# Patient Record
Sex: Male | Born: 1995 | Race: White | Hispanic: No | Marital: Single | Smoking: Never smoker
Health system: Southern US, Community
[De-identification: ages and names within clinical notes are randomized; demographics above are authoritative.]

---

## 2016-10-27 ENCOUNTER — Encounter (INDEPENDENT_AMBULATORY_CARE_PROVIDER_SITE_OTHER): Payer: Self-pay

## 2016-11-04 ENCOUNTER — Ambulatory Visit (INDEPENDENT_AMBULATORY_CARE_PROVIDER_SITE_OTHER): Payer: Self-pay | Admitting: Podiatry

## 2016-11-04 ENCOUNTER — Ambulatory Visit: Payer: Self-pay

## 2016-11-04 ENCOUNTER — Encounter: Payer: Self-pay | Admitting: Podiatry

## 2016-11-04 VITALS — BP 118/79 | HR 64 | Ht 68.0 in | Wt 180.0 lb

## 2016-11-04 DIAGNOSIS — R252 Cramp and spasm: Secondary | ICD-10-CM

## 2016-11-04 DIAGNOSIS — Q6602 Congenital talipes equinovarus, left foot: Secondary | ICD-10-CM

## 2016-11-04 DIAGNOSIS — Q660 Congenital talipes equinovarus: Secondary | ICD-10-CM

## 2016-11-04 MED ORDER — CYCLOBENZAPRINE HCL 10 MG PO TABS: 10.0000 mg | ORAL_TABLET | Freq: Three times a day (TID) | ORAL | 0 refills | Status: DC | PRN
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
## 2016-11-04 NOTE — Addendum Note (Signed)
Addended by: Renaldo ReelPARRY, Talecia Sherlin A on: 11/04/2016 02:21 PM   Modules accepted: Orders

## 2017-10-06 ENCOUNTER — Encounter (HOSPITAL_COMMUNITY): Payer: Self-pay | Admitting: Behavioral Health

## 2017-10-06 ENCOUNTER — Other Ambulatory Visit: Payer: Self-pay

## 2017-10-06 ENCOUNTER — Inpatient Hospital Stay (HOSPITAL_COMMUNITY)
Admission: AD | Admit: 2017-10-06 | Discharge: 2017-10-12 | DRG: 885 | Disposition: A | Discharge status: Home / Self Care | Payer: Federal, State, Local not specified - Other | Source: Intra-hospital | Attending: Psychiatry | Admitting: Psychiatry

## 2017-10-06 DIAGNOSIS — Z560 Unemployment, unspecified: Secondary | ICD-10-CM | POA: Diagnosis not present

## 2017-10-06 DIAGNOSIS — F1520 Other stimulant dependence, uncomplicated: Secondary | ICD-10-CM | POA: Diagnosis present

## 2017-10-06 DIAGNOSIS — F1220 Cannabis dependence, uncomplicated: Secondary | ICD-10-CM | POA: Diagnosis present

## 2017-10-06 DIAGNOSIS — R110 Nausea: Secondary | ICD-10-CM | POA: Diagnosis not present

## 2017-10-06 DIAGNOSIS — R45851 Suicidal ideations: Secondary | ICD-10-CM | POA: Diagnosis present

## 2017-10-06 DIAGNOSIS — Z818 Family history of other mental and behavioral disorders: Secondary | ICD-10-CM | POA: Diagnosis not present

## 2017-10-06 DIAGNOSIS — M25572 Pain in left ankle and joints of left foot: Secondary | ICD-10-CM | POA: Diagnosis present

## 2017-10-06 DIAGNOSIS — T43225A Adverse effect of selective serotonin reuptake inhibitors, initial encounter: Secondary | ICD-10-CM | POA: Diagnosis not present

## 2017-10-06 DIAGNOSIS — J45909 Unspecified asthma, uncomplicated: Secondary | ICD-10-CM | POA: Diagnosis present

## 2017-10-06 DIAGNOSIS — Z915 Personal history of self-harm: Secondary | ICD-10-CM

## 2017-10-06 DIAGNOSIS — F1994 Other psychoactive substance use, unspecified with psychoactive substance-induced mood disorder: Secondary | ICD-10-CM | POA: Diagnosis present

## 2017-10-06 DIAGNOSIS — Y92230 Patient room in hospital as the place of occurrence of the external cause: Secondary | ICD-10-CM | POA: Diagnosis not present

## 2017-10-06 DIAGNOSIS — F419 Anxiety disorder, unspecified: Secondary | ICD-10-CM | POA: Diagnosis present

## 2017-10-06 DIAGNOSIS — F332 Major depressive disorder, recurrent severe without psychotic features: Secondary | ICD-10-CM | POA: Diagnosis present

## 2017-10-06 DIAGNOSIS — G8929 Other chronic pain: Secondary | ICD-10-CM | POA: Diagnosis present

## 2017-10-06 DIAGNOSIS — Z79899 Other long term (current) drug therapy: Secondary | ICD-10-CM

## 2017-10-06 DIAGNOSIS — Q6689 Other  specified congenital deformities of feet: Secondary | ICD-10-CM | POA: Diagnosis not present

## 2017-10-06 DIAGNOSIS — R450 Nervousness: Secondary | ICD-10-CM | POA: Diagnosis not present

## 2017-10-06 DIAGNOSIS — G4700 Insomnia, unspecified: Secondary | ICD-10-CM | POA: Diagnosis present

## 2017-10-06 LAB — RAPID URINE DRUG SCREEN, HOSP PERFORMED
Amphetamines: POSITIVE — AB
BARBITURATES: NOT DETECTED
BENZODIAZEPINES: NOT DETECTED
COCAINE: NOT DETECTED
Opiates: NOT DETECTED
Tetrahydrocannabinol: POSITIVE — AB

## 2017-10-06 MED ORDER — MAGNESIUM HYDROXIDE 400 MG/5ML PO SUSP: 30.0000 mL | Freq: Every day | ORAL | Status: DC | PRN

## 2017-10-06 MED ORDER — GABAPENTIN 300 MG PO CAPS
300.0000 mg | ORAL_CAPSULE | Freq: Three times a day (TID) | ORAL | Status: DC
  Filled 2017-10-06 (×7): qty 1

## 2017-10-06 MED ORDER — ACETAMINOPHEN 325 MG PO TABS: 650.0000 mg | ORAL_TABLET | Freq: Four times a day (QID) | ORAL | Status: DC | PRN

## 2017-10-06 MED ORDER — HYDROXYZINE HCL 25 MG PO TABS
25.0000 mg | ORAL_TABLET | Freq: Three times a day (TID) | ORAL | Status: DC | PRN
  Filled 2017-10-06: qty 10

## 2017-10-06 MED ORDER — TRAZODONE HCL 50 MG PO TABS
50.0000 mg | ORAL_TABLET | Freq: Every evening | ORAL | Status: DC | PRN
  Administered 2017-10-11: 50 mg via ORAL
  Filled 2017-10-06 (×3): qty 1
  Filled 2017-10-06: qty 7

## 2017-10-06 MED ORDER — PNEUMOCOCCAL VAC POLYVALENT 25 MCG/0.5ML IJ INJ: 0.5000 mL | INJECTION | INTRAMUSCULAR | Status: DC

## 2017-10-06 MED ORDER — ALUM & MAG HYDROXIDE-SIMETH 200-200-20 MG/5ML PO SUSP: 30.0000 mL | ORAL | Status: DC | PRN

## 2017-10-06 NOTE — Progress Notes (Signed)
Unable to release signed and held orders.  On-site provider notified and new admission orders were placed and released.
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
## 2017-10-06 NOTE — Tx Team (Signed)
Initial Treatment Plan 10/06/2017 2:15 PM Bradley ArntAlex J Bunning [id number]:[id number]    PATIENT STRESSORS: Financial difficulties Health problems Marital or family conflict Occupational concerns Substance abuse   PATIENT STRENGTHS: Ability for insight Capable of independent living Communication skills General fund of knowledge Motivation for treatment/growth   PATIENT IDENTIFIED PROBLEMS: "learn skills to connect with people"  "learn healthy coping skills to battle addiction"                   DISCHARGE CRITERIA:  Adequate post-discharge living arrangements Improved stabilization in mood, thinking, and/or behavior  PRELIMINARY DISCHARGE PLAN: Return to previous living arrangement  PATIENT/FAMILY INVOLVEMENT: This treatment plan has been presented to and reviewed with the patient, Bradley Gibson.  The patient and family have been given the opportunity to ask questions and make suggestions.  Raylene MiyamotoMichael R Cherylin Waguespack, RN 10/06/2017, 2:15 PM

## 2017-10-06 NOTE — Progress Notes (Signed)
D: Pt was in bed in his room upon initial approach.  Pt presents with depressed affect and mood.  He reports he is feeling "fine" and his goal is to "catch up on some sleep."  Pt denies SI/HI, denies hallucinations, denies pain, denies withdrawal symptoms.  Pt has been isolative to his room tonight and he was excused from evening group.   A: Introduced self to pt.  Actively listened to pt and offered support and encouragement. Q15 minute safety checks maintained.  R: Pt is safe on the unit.  Pt verbally contracts for safety.  Will continue to monitor and assess.
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
## 2017-10-07 ENCOUNTER — Encounter (HOSPITAL_COMMUNITY): Payer: Self-pay | Admitting: Behavioral Health

## 2017-10-07 DIAGNOSIS — R450 Nervousness: Secondary | ICD-10-CM

## 2017-10-07 DIAGNOSIS — G4700 Insomnia, unspecified: Secondary | ICD-10-CM

## 2017-10-07 DIAGNOSIS — F1994 Other psychoactive substance use, unspecified with psychoactive substance-induced mood disorder: Secondary | ICD-10-CM

## 2017-10-07 DIAGNOSIS — F332 Major depressive disorder, recurrent severe without psychotic features: Principal | ICD-10-CM

## 2017-10-07 DIAGNOSIS — R45851 Suicidal ideations: Secondary | ICD-10-CM

## 2017-10-07 LAB — CBC WITH DIFFERENTIAL/PLATELET
BASOS ABS: 0.1 10*3/uL (ref 0.0–0.1)
BASOS PCT: 1 %
EOS ABS: 0.6 10*3/uL (ref 0.0–0.7)
Eosinophils Relative: 6 %
HCT: 52.1 % — ABNORMAL HIGH (ref 39.0–52.0)
HEMOGLOBIN: 17.6 g/dL — AB (ref 13.0–17.0)
Lymphocytes Relative: 39 %
Lymphs Abs: 3.8 10*3/uL (ref 0.7–4.0)
MCH: 30.1 pg (ref 26.0–34.0)
MCHC: 33.8 g/dL (ref 30.0–36.0)
MCV: 89.2 fL (ref 78.0–100.0)
MONO ABS: 0.6 10*3/uL (ref 0.1–1.0)
MONOS PCT: 7 %
NEUTROS ABS: 4.5 10*3/uL (ref 1.7–7.7)
NEUTROS PCT: 47 %
Platelets: 231 10*3/uL (ref 150–400)
RBC: 5.84 MIL/uL — ABNORMAL HIGH (ref 4.22–5.81)
RDW: 14 % (ref 11.5–15.5)
WBC: 9.6 10*3/uL (ref 4.0–10.5)

## 2017-10-07 LAB — COMPREHENSIVE METABOLIC PANEL
ALBUMIN: 4.6 g/dL (ref 3.5–5.0)
ALT: 29 U/L (ref 0–44)
ANION GAP: 16 — AB (ref 5–15)
AST: 31 U/L (ref 15–41)
Alkaline Phosphatase: 67 U/L (ref 38–126)
BILIRUBIN TOTAL: 1.6 mg/dL — AB (ref 0.3–1.2)
BUN: 11 mg/dL (ref 6–20)
CHLORIDE: 106 mmol/L (ref 98–111)
CO2: 20 mmol/L — AB (ref 22–32)
Calcium: 9.6 mg/dL (ref 8.9–10.3)
Creatinine, Ser: 1.27 mg/dL — ABNORMAL HIGH (ref 0.61–1.24)
GFR calc Af Amer: >60 mL/min (ref >60)
GFR calc non Af Amer: >60 mL/min (ref >60)
Glucose, Bld: 86 mg/dL (ref 70–99)
POTASSIUM: 3.8 mmol/L (ref 3.5–5.1)
SODIUM: 142 mmol/L (ref 135–145)
Total Protein: 7.9 g/dL (ref 6.5–8.1)

## 2017-10-07 LAB — LIPID PANEL
CHOL/HDL RATIO: 2.4 ratio
Cholesterol: 100 mg/dL (ref 0–200)
HDL: 42 mg/dL (ref >40)
LDL CALC: 49 mg/dL (ref 0–99)
Triglycerides: 47 mg/dL (ref <150)
VLDL: 9 mg/dL (ref 0–40)

## 2017-10-07 LAB — HEMOGLOBIN A1C
Hgb A1c MFr Bld: 4.9 % (ref 4.8–5.6)
Mean Plasma Glucose: 93.93 mg/dL

## 2017-10-07 LAB — TSH: TSH: 2.465 u[IU]/mL (ref 0.350–4.500)

## 2017-10-07 LAB — ETHANOL: Alcohol, Ethyl (B): <10 mg/dL (ref <10)

## 2017-10-07 MED ORDER — SERTRALINE HCL 50 MG PO TABS
50.0000 mg | ORAL_TABLET | Freq: Every day | ORAL | Status: DC
  Administered 2017-10-07 – 2017-10-08 (×2): 50 mg via ORAL
  Filled 2017-10-07 (×5): qty 1

## 2017-10-07 MED ORDER — GABAPENTIN 100 MG PO CAPS
100.0000 mg | ORAL_CAPSULE | Freq: Three times a day (TID) | ORAL | Status: DC
  Filled 2017-10-07 (×9): qty 1

## 2017-10-07 NOTE — Progress Notes (Signed)
Pt did not attend NA group this evening.  
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
## 2017-10-07 NOTE — [facility] Group Notes (Signed)
LCSW Group Therapy Note 10/07/2017 3:34 PM  Type of Therapy and Topic: Group Therapy: Overcoming Obstacles  Participation Level: Active  Description of Group:  In this group patients will be encouraged to explore what they see as obstacles to their own wellness and recovery. They will be guided to discuss their thoughts, feelings, and behaviors related to these obstacles. The group will process together ways to cope with barriers, with attention given to specific choices patients can make. Each patient will be challenged to identify changes they are motivated to make in order to overcome their obstacles. This group will be process-oriented, with patients participating in exploration of their own experiences as well as giving and receiving support and challenge from other group members.  Therapeutic Goals: 1. Patient will identify personal and current obstacles as they relate to admission. 2. Patient will identify barriers that currently interfere with their wellness or overcoming obstacles.  3. Patient will identify feelings, thought process and behaviors related to these barriers. 4. Patient will identify two changes they are willing to make to overcome these obstacles:   Summary of Patient Progress Trinna Postlex was engaged and participated throughout the group session. Deveon reports that his current obstacle is "I'm trying to figure out what is going on in my head". Trinna Postlex states that he does not know how he plans to overcome this obstacle at this time.     Therapeutic Modalities:  Cognitive Behavioral Therapy Solution Focused Therapy Motivational Interviewing Relapse Prevention Therapy   Alcario DroughtJolan Dayten Juba LCSWA Clinical Social Worker

## 2017-10-07 NOTE — Plan of Care (Signed)
  Problem: Safety: Goal: Periods of time without injury will increase Outcome: Progressing-No self injurious behaviors observed.   Problem: Self-Concept: Goal: Ability to disclose and discuss suicidal ideas will improve Outcome: Progressing-Pt disclosed to writer passive SI with no plan or intent. Pt verbalized being able to talk to staff and not act out on suicidal thoughts.

## 2017-10-07 NOTE — Tx Team (Signed)
Interdisciplinary Treatment and Diagnostic Plan Update  10/07/2017 Time of Session: 10:40am Bradley Gibson MRN: [id number]  Principal Diagnosis: Major depressive disorder, recurrent episode, severe (HCC)  Secondary Diagnoses: Principal Problem:   Major depressive disorder, recurrent episode, severe (HCC) Active Problems:   Substance induced mood disorder (HCC)   Current Medications:  Current Facility-Administered Medications  Medication Dose Route Frequency Provider Last Rate Last Dose  . acetaminophen (TYLENOL) tablet 650 mg  650 mg Oral Q6H PRN Parks, Laurie Britton, NP      . alum & mag hydroxide-simeth (MAALOX/MYLANTA) 200-200-20 MG/5ML suspension 30 mL  30 mL Oral Q4H PRN Parks, Laurie Britton, NP      . gabapentin (NEURONTIN) capsule 300 mg  300 mg Oral TID Parks, Laurie Britton, NP      . hydrOXYzine (ATARAX/VISTARIL) tablet 25 mg  25 mg Oral TID PRN Parks, Laurie Britton, NP      . magnesium hydroxide (MILK OF MAGNESIA) suspension 30 mL  30 mL Oral Daily PRN Parks, Laurie Britton, NP      . pneumococcal 23 valent vaccine (PNU-IMMUNE) injection 0.5 mL  0.5 mL Intramuscular Tomorrow-1000 Cobos, Fernando A, MD      . traZODone (DESYREL) tablet 50 mg  50 mg Oral QHS PRN Parks, Laurie Britton, NP       PTA Medications: Medications Prior to Admission  Medication Sig Dispense Refill Last Dose  . albuterol (PROVENTIL HFA) 108 (90 Base) MCG/ACT inhaler    Taking  . cyclobenzaprine (FLEXERIL) 10 MG tablet Take 1 tablet (10 mg total) by mouth 3 (three) times daily as needed for muscle spasms. (Patient not taking: Reported on 10/06/2017) 30 tablet 0 Not Taking at Unknown time    Patient Stressors: Financial difficulties Health problems Marital or family conflict Occupational concerns Substance abuse  Patient Strengths: Ability for insight Capable of independent living Communication skills General fund of knowledge Motivation for treatment/growth  Treatment Modalities: Medication  Management, Group therapy, Case management,  1 to 1 session with clinician, Psychoeducation, Recreational therapy.   Physician Treatment Plan for Primary Diagnosis: Major depressive disorder, recurrent episode, severe (HCC) Long Term Goal(s): Improvement in symptoms so as ready for discharge Improvement in symptoms so as ready for discharge   Short Term Goals: Ability to identify changes in lifestyle to reduce recurrence of condition will improve Ability to verbalize feelings will improve Ability to disclose and discuss suicidal ideas Compliance with prescribed medications will improve Ability to identify triggers associated with substance abuse/mental health issues will improve Ability to disclose and discuss suicidal ideas Ability to demonstrate self-control will improve Ability to identify and develop effective coping behaviors will improve Ability to maintain clinical measurements within normal limits will improve  Medication Management: Evaluate patient's response, side effects, and tolerance of medication regimen.  Therapeutic Interventions: 1 to 1 sessions, Unit Group sessions and Medication administration.  Evaluation of Outcomes: Not Met  Physician Treatment Plan for Secondary Diagnosis: Principal Problem:   Major depressive disorder, recurrent episode, severe (HCC) Active Problems:   Substance induced mood disorder (HCC)  Long Term Goal(s): Improvement in symptoms so as ready for discharge Improvement in symptoms so as ready for discharge   Short Term Goals: Ability to identify changes in lifestyle to reduce recurrence of condition will improve Ability to verbalize feelings will improve Ability to disclose and discuss suicidal ideas Compliance with prescribed medications will improve Ability to identify triggers associated with substance abuse/mental health issues will improve Ability to disclose and discuss suicidal ideas Ability to   demonstrate self-control will  improve Ability to identify and develop effective coping behaviors will improve Ability to maintain clinical measurements within normal limits will improve     Medication Management: Evaluate patient's response, side effects, and tolerance of medication regimen.  Therapeutic Interventions: 1 to 1 sessions, Unit Group sessions and Medication administration.  Evaluation of Outcomes: Not Met   RN Treatment Plan for Primary Diagnosis: Major depressive disorder, recurrent episode, severe (HCC) Long Term Goal(s): Knowledge of disease and therapeutic regimen to maintain health will improve  Short Term Goals: Ability to participate in decision making will improve, Ability to verbalize feelings will improve, Ability to identify and develop effective coping behaviors will improve and Compliance with prescribed medications will improve  Medication Management: RN will administer medications as ordered by provider, will assess and evaluate patient's response and provide education to patient for prescribed medication. RN will report any adverse and/or side effects to prescribing provider.  Therapeutic Interventions: 1 on 1 counseling sessions, Psychoeducation, Medication administration, Evaluate responses to treatment, Monitor vital signs and CBGs as ordered, Perform/monitor CIWA, COWS, AIMS and Fall Risk screenings as ordered, Perform wound care treatments as ordered.  Evaluation of Outcomes: Not Met   LCSW Treatment Plan for Primary Diagnosis: Major depressive disorder, recurrent episode, severe (HCC) Long Term Goal(s): Safe transition to appropriate next level of care at discharge, Engage patient in therapeutic group addressing interpersonal concerns.  Short Term Goals: Engage patient in aftercare planning with referrals and resources  Therapeutic Interventions: Assess for all discharge needs, 1 to 1 time with Social worker, Explore available resources and support systems, Assess for adequacy in  community support network, Educate family and significant other(s) on suicide prevention, Complete Psychosocial Assessment, Interpersonal group therapy.  Evaluation of Outcomes: Not Met   Progress in Treatment: Attending groups: No. New to unit Participating in groups: No. Taking medication as prescribed: Yes. Toleration medication: Yes. Family/Significant other contact made: No, will contact:  patient refuses consent Patient understands diagnosis: Yes. Discussing patient identified problems/goals with staff: Yes. Medical problems stabilized or resolved: Yes. Denies suicidal/homicidal ideation: Yes. Issues/concerns per patient self-inventory: No. Other:   New problem(s) identified: None   New Short Term/Long Term Goal(s): Detox, medication stabilization, elimination of SI thoughts, development of comprehensive mental wellness plan.   Patient Goals:  ZTo learn skills to connect with people and to learn healthy copings skills to battle addiction  Discharge Plan or Barriers: CSW will assess for appropriate referrals and discharge planning.   Reason for Continuation of Hospitalization: Anxiety Depression Medication stabilization Suicidal ideation  Estimated Length of Stay: 3-5 days   Attendees: Patient: 10/07/2017 10:53 AM  Physician: Dr. Fernando Cobos, MD 10/07/2017 10:53 AM  Nursing: Patrice White, RN 10/07/2017 10:53 AM  RN Care Manager: X 10/07/2017 10:53 AM  Social Worker:  , LCSWA 10/07/2017 10:53 AM  Recreational Therapist: X 10/07/2017 10:53 AM  Other: X 10/07/2017 10:53 AM  Other: X 10/07/2017 10:53 AM  Other:X 10/07/2017 10:53 AM    Scribe for Treatment Team:  E , LCSWA 10/07/2017 10:53 AM 

## 2017-10-07 NOTE — Progress Notes (Signed)
Pt presents with a flat affect and depressed mood. Pt endorses passive suicidal thoughts with no plan or intent. Pt reported feeling depressed but stated that he doesn't want to take antidepressants because they make him fell like a zombie. Pt refused to take Neurontin as scheduled this morning, when offered. Pt stated that he wasn't taking Neurontin prior to admission. Pt denies having any anxiety and doesn't feel the need to take Neurontin. Pt would like to discuss medications with MD. Dr. Jama Flavorsobos made aware of pt's request in tx team. Pt rated on his self inventory sheet: depression 7/10, anxiety 0, and hopelessness 7/10.   Medications reviewed with pt. V/s assessed. Verbal support provided. Pt encouraged to attend groups. 15 minute checks performed for safety.  Pt receptive to tx and would like to discuss tx plan with MD.
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
## 2017-10-07 NOTE — [facility] Suicide Risk Assessment (Signed)
[facility] INPATIENT:  Family/Significant Other Suicide Prevention Education  Suicide Prevention Education:  Patient Refusal for Family/Significant Other Suicide Prevention Education: The patient Bradley Gibson has refused to provide written consent for family/significant other to be provided Family/Significant Other Suicide Prevention Education during admission and/or prior to discharge.  Physician notified.  SPE completed with patient, as patient refused to consent to family contact. SPI pamphlet provided to pt and pt was encouraged to share information with support network, ask questions, and talk about any concerns relating to SPE. Patient denies access to guns/firearms and verbalized understanding of information provided. Mobile Crisis information also provided to patient.    [name] 10/07/2017, 9:47 AM
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
## 2017-10-08 ENCOUNTER — Encounter (HOSPITAL_COMMUNITY): Payer: Self-pay | Admitting: Behavioral Health

## 2017-10-08 NOTE — Progress Notes (Signed)
Pt presents with a flat affect and depressed mood. Pt reported that he doesn't consider himself to be depressed but feels numb and empty inside. Pt expressed having difficulty connecting with people emotionally. Pt denies SI but reported not caring if he's dead or alive. Pt denies wanting to harm himself. Pt reported taking Zoloft yesterday for the first time and experienced hot flashes after taking the medicine. Pt refuses to take Neurontin as scheduled due to not having anxiety. Pt less isolative today and is compliant with attending groups.   Medications reviewed with pt. Dr. Jama Flavorsobos informed of pt complaints regarding medications. Verbal support provided. Pt encouraged to attend groups. 15 minute checks performed for safety.  Pt receptive to tx.

## 2017-10-08 NOTE — Progress Notes (Signed)
Patient asked to come up for labs however patient states, "I was stuck yesterday. I'm not doing it again today." [facility] and phlebotomist informed.

## 2017-10-08 NOTE — Plan of Care (Signed)
  PProblem: Activity: Goal: Interest or engagement in activities will improve Outcome: Progressing-pt visible in the milieu today. Pt observed engaging with peers and attending scheduled groups.

## 2017-10-08 NOTE — Progress Notes (Signed)
D: Patient observed resting in bed all evening. States, "I've been in bed most of the day. I'm just not into groups really." Patient's affect flat, mood somewhat irritable. Forwards minimal information and is guarded. Denies pain, physical complaints.   A: No medications scheduled at this time. No prns requested or reuired. Medication education provided. Level III obs in place for safety. Emotional support offered. Patient encouraged to complete Suicide Safety Plan before discharge. Encouraged to attend and participate in unit programming.  Fall prevention plan in place and reviewed with patient as pt is a high fall risk.   R: Patient verbalizes understanding of POC, falls prevention education. Patient denies SI/HI/AVH and remains safe on level III obs. Will continue to monitor throughout the night.

## 2017-10-08 NOTE — Progress Notes (Signed)
Pt stated that today was a good day. His short term goal is to come up with an excuse as to where he has been the past week. His long term goal is [facility].

## 2017-10-08 NOTE — [facility] Group Notes (Signed)
LCSW Group Therapy Note 10/08/2017 3:27 PM  Type of Therapy and Topic: Group Therapy: Avoiding Self-Sabotaging and Enabling Behaviors  Participation Level: Active  Description of Group:  In this group, patients will learn how to identify obstacles, self-sabotaging and enabling behaviors, as well as: what are they, why do we do them and what needs these behaviors meet. Discuss unhealthy relationships and how to have positive healthy boundaries with those that sabotage and enable. Explore aspects of self-sabotage and enabling in yourself and how to limit these self-destructive behaviors in everyday life.  Therapeutic Goals: 1. Patient will identify one obstacle that relates to self-sabotage and enabling behaviors 2. Patient will identify one personal self-sabotaging or enabling behavior they did prior to admission 3. Patient will state a plan to change the above identified behavior 4. Patient will demonstrate ability to communicate their needs through discussion and/or role play.   Summary of Patient Progress:  Bradley Gibson was engaged and participated throughout the group session. Bradley Gibson states that his self sabotaging behaviors are "doing things I know is not good for me".     Therapeutic Modalities:  Cognitive Behavioral Therapy Person-Centered Therapy Motivational Interviewing   Bradley Gibson LCSWA Clinical Social Worker

## 2017-10-08 NOTE — Progress Notes (Addendum)
[facility] MD Progress Note  10/08/2017 11:46 AM Bradley Gibson  MRN:  [id number]  Subjective:  " I wasn't really depressed when I got here. I just needed to get my mind right. I don't want to take none of the medication any more. I just need to talk to someone when I get out of here."  Objective: Face to face evaluation completed, case discussed with treatment team and chart reviewed. Bradley Gibson an 22 y.o.malewhowas admitted to the unit following depression and suicidal thoughts with plans to crash his car.  During this evaluation, he is alert and oriented x4, calm and cooperative. His mood is depressed and his affect is congruent and constricted. Although his mood appears to be depressed he is minimizing depression at this time. He does note that at times, he does feel empty inside. He denies any significant neuro-vegetative symptoms. He denies anxiety, auditory or visual hallucinations, passive death wishes or active/passive suicidal thoughts. He was started on Zoloft 50 mgrs QDAY and Neurontin 100 mgrs TID however, he reports he does not want to take the medications anymore. He reports Zoloft causes nausea and states he also experienced some hot and cold spells lastnight although the Zoloft was give mid day.  Reports he does not need to be on the Neurontin because he does not feel anxious. Discussed other antidepressant options and the benefits of taking the medication although he was not open to reducing the Zoloft or switching medications. At this time, he reports he is only open to therapy only.  He denies acute pain. Describes both sleeping pattern and appetite as fair. He denies active withdrawal symptoms. At this time, he is contracting for safety on the unit.      Principal Problem: Major depressive disorder, recurrent episode, severe ([place]) Diagnosis:   Patient Active Problem List   Diagnosis Date Noted  . Major depressive disorder, recurrent episode, severe ([place]) [F33.2] 10/06/2017  .  Substance induced mood disorder ([place]) [F19.94] 10/06/2017   Total Time spent with patient: 20 minutes  Past Psychiatric History: Bipolar, depression, multiple SA as per patient report. Patient has been to [facility] in 2014. He has been on Lithium and Trazodone in the past    Past Medical History: History reviewed. No pertinent past medical history. History reviewed. No pertinent surgical history. Family History: History reviewed. No pertinent family history. Family Psychiatric  History: Mother-Bipolar, depression, psychosis,  substance abuse (pills as per patient report), Father- methamphetamine and alcohol abuse, Bipolar,  Brother has been psychiatrically hospitalized in the past.  Social History:  Social History   Substance and Sexual Activity  Alcohol Use Not on file     Social History   Substance and Sexual Activity  Drug Use Not on file    Social History   Socioeconomic History  . Marital status: Single    Spouse name: Not on file  . Number of children: Not on file  . Years of education: Not on file  . Highest education level: Not on file  Occupational History  . Not on file  Social Needs  . Financial resource strain: Not on file  . Food insecurity:    Worry: Not on file    Inability: Not on file  . Transportation needs:    Medical: Not on file    Non-medical: Not on file  Tobacco Use  . Smoking status: Never Smoker  . Smokeless tobacco: Never Used  Substance and Sexual Activity  . Alcohol use: Not on file  .  Drug use: Not on file  . Sexual activity: Not on file  Lifestyle  . Physical activity:    Days per week: Not on file    Minutes per session: Not on file  . Stress: Not on file  Relationships  . Social connections:    Talks on phone: Not on file    Gets together: Not on file    Attends religious service: Not on file    Active member of club or organization: Not on file    Attends meetings of clubs or organizations: Not on file    Relationship  status: Not on file  Other Topics Concern  . Not on file  Social History Narrative  . Not on file   Additional Social History:    Pain Medications: see MAR Prescriptions: see MAR Over the Counter: see MAR History of alcohol / drug use?: Yes Name of Substance 1: Methamphetamine, IV 1 - Amount (size/oz): 1/2 gram - gram 1 - Frequency: 2-3x weekly 1 - Last Use / Amount: 10/05/17 Name of Substance 2: THC 2 - Amount (size/oz): 1 joint  2 - Frequency: q d 2 - Last Use / Amount: 10/04/17    Sleep: Fair  Appetite:  Fair  Current Medications: Current Facility-Administered Medications  Medication Dose Route Frequency Provider Last Rate Last Dose  . acetaminophen (TYLENOL) tablet 650 mg  650 mg Oral Q6H PRN Ethelene Hal, NP      . alum & mag hydroxide-simeth (MAALOX/MYLANTA) 200-200-20 MG/5ML suspension 30 mL  30 mL Oral Q4H PRN Ethelene Hal, NP      . gabapentin (NEURONTIN) capsule 100 mg  100 mg Oral TID Bradley Gibson, Myer Peer, MD      . hydrOXYzine (ATARAX/VISTARIL) tablet 25 mg  25 mg Oral TID PRN Ethelene Hal, NP      . magnesium hydroxide (MILK OF MAGNESIA) suspension 30 mL  30 mL Oral Daily PRN Ethelene Hal, NP      . pneumococcal 23 valent vaccine (PNU-IMMUNE) injection 0.5 mL  0.5 mL Intramuscular Tomorrow-1000 Bradley Binford A, MD      . sertraline (ZOLOFT) tablet 50 mg  50 mg Oral Daily Bradley Gibson, Myer Peer, MD   50 mg at 10/08/17 0759  . traZODone (DESYREL) tablet 50 mg  50 mg Oral QHS PRN Ethelene Hal, NP        Lab Results:  Results for orders placed or performed during the hospital encounter of 10/06/17 (from the past 48 hour(s))  Urine rapid drug screen (hosp performed)     Status: Abnormal   Collection Time: 10/06/17  2:31 PM  Result Value Ref Range   Opiates NONE DETECTED NONE DETECTED   Cocaine NONE DETECTED NONE DETECTED   Benzodiazepines NONE DETECTED NONE DETECTED   Amphetamines POSITIVE (Gibson) NONE DETECTED    Tetrahydrocannabinol POSITIVE (Gibson) NONE DETECTED   Barbiturates NONE DETECTED NONE DETECTED    Comment: (NOTE) DRUG SCREEN FOR MEDICAL PURPOSES ONLY.  IF CONFIRMATION IS NEEDED FOR ANY PURPOSE, NOTIFY LAB WITHIN 5 DAYS. LOWEST DETECTABLE LIMITS FOR URINE DRUG SCREEN Drug Class                     Cutoff (ng/mL) Amphetamine and metabolites    1000 Barbiturate and metabolites    200 Benzodiazepine                 157 Tricyclics and metabolites     300 Opiates and metabolites  300 Cocaine and metabolites        300 THC                            50 Performed at [facility], [place] [address], [place], [place] [address]   TSH     Status: None   Collection Time: 10/07/17  6:28 AM  Result Value Ref Range   TSH 2.465 0.350 - 4.500 uIU/mL    Comment: Performed by Gibson 3rd Generation assay with Gibson functional sensitivity of <=0.01 uIU/mL. Performed at [facility], [place] [address] [place], [place] [address]   Hemoglobin A1c     Status: None   Collection Time: 10/07/17  6:28 AM  Result Value Ref Range   Hgb A1c MFr Bld 4.9 4.8 - 5.6 %    Comment: (NOTE) Pre diabetes:          5.7%-6.4% Diabetes:              >6.4% Glycemic control for   <7.0% adults with diabetes    Mean Plasma Glucose 93.93 mg/dL    Comment: Performed at [place] [facility] [address], [place], [place] [address]  Lipid panel     Status: None   Collection Time: 10/07/17  6:28 AM  Result Value Ref Range   Cholesterol 100 0 - 200 mg/dL   Triglycerides 47 <150 mg/dL   HDL 42 >40 mg/dL   Total CHOL/HDL Ratio 2.4 RATIO   VLDL 9 0 - 40 mg/dL   LDL Cholesterol 49 0 - 99 mg/dL    Comment:        Total Cholesterol/HDL:CHD Risk Coronary Heart Disease Risk Table                     Men   Women  1/2 Average Risk   3.4   3.3  Average Risk       5.0   4.4  2 X Average Risk   9.6   7.1  3 X Average Risk  23.4   11.0        Use the calculated Patient Ratio above and the CHD Risk  Table to determine the patient's CHD Risk.        ATP III CLASSIFICATION (LDL):  <100     mg/dL   Optimal  100-129  mg/dL   Near or Above                    Optimal  130-159  mg/dL   Borderline  160-189  mg/dL   High  >190     mg/dL   Very High Performed at [facility] [address] [place], [place] [address]   CBC with Differential/Platelet     Status: Abnormal   Collection Time: 10/07/17  6:28 AM  Result Value Ref Range   WBC 9.6 4.0 - 10.5 K/uL   RBC 5.84 (H) 4.22 - 5.81 MIL/uL   Hemoglobin 17.6 (H) 13.0 - 17.0 g/dL   HCT 52.1 (H) 39.0 - 52.0 %   MCV 89.2 78.0 - 100.0 fL   MCH 30.1 26.0 - 34.0 pg   MCHC 33.8 30.0 - 36.0 g/dL   RDW 14.0 11.5 - 15.5 %   Platelets 231 150 - 400 K/uL   Neutrophils Relative % 47 %   Neutro Abs 4.5 1.7 - 7.7 K/uL   Lymphocytes Relative 39 %  Lymphs Abs 3.8 0.7 - 4.0 K/uL   Monocytes Relative 7 %   Monocytes Absolute 0.6 0.1 - 1.0 K/uL   Eosinophils Relative 6 %   Eosinophils Absolute 0.6 0.0 - 0.7 K/uL   Basophils Relative 1 %   Basophils Absolute 0.1 0.0 - 0.1 K/uL    Comment: Performed at [facility], [place] [address] [place], [place] [address]  Comprehensive metabolic panel     Status: Abnormal   Collection Time: 10/07/17  6:28 AM  Result Value Ref Range   Sodium 142 135 - 145 mmol/L   Potassium 3.8 3.5 - 5.1 mmol/L   Chloride 106 98 - 111 mmol/L   CO2 20 (L) 22 - 32 mmol/L   Glucose, Bld 86 70 - 99 mg/dL   BUN 11 6 - 20 mg/dL   Creatinine, Ser 1.27 (H) 0.61 - 1.24 mg/dL   Calcium 9.6 8.9 - 10.3 mg/dL   Total Protein 7.9 6.5 - 8.1 g/dL   Albumin 4.6 3.5 - 5.0 g/dL   AST 31 15 - 41 U/L   ALT 29 0 - 44 U/L   Alkaline Phosphatase 67 38 - 126 U/L   Total Bilirubin 1.6 (H) 0.3 - 1.2 mg/dL   GFR calc non Af Amer >60 >60 mL/min   GFR calc Af Amer >60 >60 mL/min    Comment: (NOTE) The eGFR has been calculated using the CKD EPI equation. This calculation has not been validated in all clinical  situations. eGFR's persistently <60 mL/min signify possible Chronic Kidney Disease.    Anion gap 16 (H) 5 - 15    Comment: Performed at [facility], [place] [address] [place], [place] [address]  Ethanol     Status: None   Collection Time: 10/07/17  6:28 AM  Result Value Ref Range   Alcohol, Ethyl (B) <10 <10 mg/dL    Comment: (NOTE) Lowest detectable limit for serum alcohol is 10 mg/dL. For medical purposes only. Performed at [facility], [place] [address], [place], [place] [address]     Blood Alcohol level:  Lab Results  Component Value Date   ETH <10 66/59/9357    Metabolic Disorder Labs: Lab Results  Component Value Date   HGBA1C 4.9 10/07/2017   MPG 93.93 10/07/2017   No results found for: PROLACTIN Lab Results  Component Value Date   CHOL 100 10/07/2017   TRIG 47 10/07/2017   HDL 42 10/07/2017   CHOLHDL 2.4 10/07/2017   VLDL 9 10/07/2017   LDLCALC 49 10/07/2017    Physical Findings: AIMS: Facial and Oral Movements Muscles of Facial Expression: None, normal Lips and Perioral Area: None, normal Jaw: None, normal Tongue: None, normal,Extremity Movements Upper (arms, wrists, hands, fingers): None, normal Lower (legs, knees, ankles, toes): None, normal, Trunk Movements Neck, shoulders, hips: None, normal, Overall Severity Severity of abnormal movements (highest score from questions above): None, normal Incapacitation due to abnormal movements: None, normal Patient's awareness of abnormal movements (rate only patient's report): No Awareness, Dental Status Current problems with teeth and/or dentures?: No Does patient usually wear dentures?: No  CIWA:    COWS:     Musculoskeletal: Strength & Muscle Tone: within normal limits Gait & Station: normal Patient leans: N/Gibson  Psychiatric Specialty Exam: Physical Exam  Nursing note and vitals reviewed. Constitutional: He is oriented to person, place, and time.  Neurological: He  is alert and oriented to person, place, and time.    Review of Systems  Psychiatric/Behavioral:  Positive for depression and substance abuse. Negative for hallucinations, memory loss and suicidal ideas. The patient is nervous/anxious. The patient does not have insomnia.   All other systems reviewed and are negative.   Blood pressure 119/83, pulse 77, temperature 98.2 F (36.8 C), resp. rate 16, height _0  (1.727 m), weight 70.3 kg, SpO2 100 %.Body mass index is 23.57 kg/m.  General Appearance: Fairly Groomed  Eye Contact:  Fair  Speech:  Clear and Coherent and Normal Rate  Volume:  Normal  Mood:  Depressedminimizing   Affect:  Restricted  Thought Process:  Coherent, Goal Directed, Linear and Descriptions of Associations: Intact  Orientation:  Full (Time, Place, and Person)  Thought Content:  Logical  Suicidal Thoughts:  No  Homicidal Thoughts:  No  Memory:  Immediate;   Fair Recent;   Fair  Judgement:  Impaired  Insight:  Shallow  Psychomotor Activity:  Normal  Concentration:  Concentration: Fair and Attention Span: Fair  Recall:  [employer] of Knowledge:  Fair  Language:  Good  Akathisia:  Negative  Handed:  Right  AIMS (if indicated):     Assets:  Communication Skills Desire for Improvement Resilience Social Support  ADL's:  Intact  Cognition:  WNL  Sleep:  Number of Hours: 5     Treatment Plan Summary: Daily contact with patient to assess and evaluate symptoms and progress in treatment  Medication management: Patient seems to be minimizing psychiatric conditions/symtpoms. He reports he does not want to take Zoloft or Neurontin any more for reasons as noted above. He shows no interest in wanting to start any other medications despite discussing benifts. He has asked that both medications are discontinued and is only open to therapy. Will discontinue these medications. Will continue Vistaril 25 mg po TID as needed for anxiety and Trazodone 50 mg po daily at bedtime for  insomnia. CSW will continue to work on discharge disposition.    Other:  Safety: Will continue 15 minute observation for safety checks. Patient is able to contract for safety on the unit at this time  Labs:TSH, HgbA1c, lipid panel normal.  UDS positive for amphetamines and THC. HIV and Hep B and C are active.   Continue to develop treatment plan to decrease risk of relapse upon discharge and to reduce the need for readmission.  Psycho-social education regarding relapse prevention and self care.  Health care follow up as needed for medical problems.  Continue to attend and participate in therapy.     Mordecai Maes, NP 10/08/2017, 11:46 AM    ..Agree with NP Progress Note

## 2017-10-09 LAB — HIV ANTIBODY (ROUTINE TESTING W REFLEX): HIV Screen 4th Generation wRfx: NONREACTIVE

## 2017-10-09 NOTE — Progress Notes (Addendum)
[facility] MD Progress Note  10/09/2017 2:07 PM SAGAN MASELLI  MRN:  [id number]  Subjective: Trinna Post reports, "I'm a little tired today. I did not sleep well last night. I don't really need medicine for sleep either. It is just, one of those nights last night. My mood is good. I have zero anxiety. I occasionally attend group sessions. The topics presented are not really important to me. I don't feel like I'm a drug addict or addicted to any drugs. I don't need residential treatment program either. If at all, I will agree to group counseling services. I mean therapy. I'm doing well".  Objective: Face to face evaluation completed, case discussed with treatment team and chart reviewed. Vanna Scotland Adkinsis an 22 y.o.malewhowas admitted to the unit following depression and suicidal thoughts with plans to crash his car.  During this evaluation, he is alert and oriented x4, calm and cooperative.  He denies any active withdrawal symptoms. He denies feeling or being depressed. He denies any anxiety issues. He denies being a drug addict. He says he does not necessarily like group sessions. He denies any issues or concerns, including SIHI, AVH, delusional thoughts or paranoia. He does not appear to be responding to any internal stimuli.  Principal Problem: Major depressive disorder, recurrent episode, severe (HCC)  Diagnosis:   Patient Active Problem List   Diagnosis Date Noted  . Major depressive disorder, recurrent episode, severe (HCC) [F33.2] 10/06/2017  . Substance induced mood disorder (HCC) [F19.94] 10/06/2017   Total Time spent with patient: 15 minutes  Past Psychiatric History: Bipolar, depression, multiple SA as per patient report. Patient has been to [facility] in 2014. He has been on Lithium and Trazodone in the past   Past Medical History: History reviewed. No pertinent past medical history. History reviewed. No pertinent surgical history.  Family History: History reviewed. No pertinent family  history.  Family Psychiatric  History: Mother-Bipolar, depression, psychosis,  substance abuse (pills as per patient report), Father- methamphetamine and alcohol abuse, Bipolar,  Brother has been psychiatrically hospitalized in the past.   Social History:  Social History   Substance and Sexual Activity  Alcohol Use Not on file     Social History   Substance and Sexual Activity  Drug Use Not on file    Social History   Socioeconomic History  . Marital status: Single    Spouse name: Not on file  . Number of children: Not on file  . Years of education: Not on file  . Highest education level: Not on file  Occupational History  . Not on file  Social Needs  . Financial resource strain: Not on file  . Food insecurity:    Worry: Not on file    Inability: Not on file  . Transportation needs:    Medical: Not on file    Non-medical: Not on file  Tobacco Use  . Smoking status: Never Smoker  . Smokeless tobacco: Never Used  Substance and Sexual Activity  . Alcohol use: Not on file  . Drug use: Not on file  . Sexual activity: Not on file  Lifestyle  . Physical activity:    Days per week: Not on file    Minutes per session: Not on file  . Stress: Not on file  Relationships  . Social connections:    Talks on phone: Not on file    Gets together: Not on file    Attends religious service: Not on file    Active member of  club or organization: Not on file    Attends meetings of clubs or organizations: Not on file    Relationship status: Not on file  Other Topics Concern  . Not on file  Social History Narrative  . Not on file   Additional Social History:  Pain Medications: see MAR Prescriptions: see MAR Over the Counter: see MAR History of alcohol / drug use?: Yes Name of Substance 1: Methamphetamine, IV 1 - Amount (size/oz): 1/2 gram - gram 1 - Frequency: 2-3x weekly 1 - Last Use / Amount: 10/05/17 Name of Substance 2: THC 2 - Amount (size/oz): 1 joint  2 - Frequency: q  d 2 - Last Use / Amount: 10/04/17  Sleep: Good  Appetite:  Good  Current Medications: Current Facility-Administered Medications  Medication Dose Route Frequency Provider Last Rate Last Dose  . acetaminophen (TYLENOL) tablet 650 mg  650 mg Oral Q6H PRN Laveda Abbe, NP      . alum & mag hydroxide-simeth (MAALOX/MYLANTA) 200-200-20 MG/5ML suspension 30 mL  30 mL Oral Q4H PRN Laveda Abbe, NP      . hydrOXYzine (ATARAX/VISTARIL) tablet 25 mg  25 mg Oral TID PRN Laveda Abbe, NP      . magnesium hydroxide (MILK OF MAGNESIA) suspension 30 mL  30 mL Oral Daily PRN Laveda Abbe, NP      . pneumococcal 23 valent vaccine (PNU-IMMUNE) injection 0.5 mL  0.5 mL Intramuscular Tomorrow-1000 Alan Riles, Rockey Situ, MD      . traZODone (DESYREL) tablet 50 mg  50 mg Oral QHS PRN Laveda Abbe, NP       Lab Results:  No results found for this or any previous visit (from the past 48 hour(s)).  Blood Alcohol level:  Lab Results  Component Value Date   ETH <10 10/07/2017   Metabolic Disorder Labs: Lab Results  Component Value Date   HGBA1C 4.9 10/07/2017   MPG 93.93 10/07/2017   No results found for: PROLACTIN Lab Results  Component Value Date   CHOL 100 10/07/2017   TRIG 47 10/07/2017   HDL 42 10/07/2017   CHOLHDL 2.4 10/07/2017   VLDL 9 10/07/2017   LDLCALC 49 10/07/2017   Physical Findings: AIMS: Facial and Oral Movements Muscles of Facial Expression: None, normal Lips and Perioral Area: None, normal Jaw: None, normal Tongue: None, normal,Extremity Movements Upper (arms, wrists, hands, fingers): None, normal Lower (legs, knees, ankles, toes): None, normal, Trunk Movements Neck, shoulders, hips: None, normal, Overall Severity Severity of abnormal movements (highest score from questions above): None, normal Incapacitation due to abnormal movements: None, normal Patient's awareness of abnormal movements (rate only patient's report): No Awareness,  Dental Status Current problems with teeth and/or dentures?: No Does patient usually wear dentures?: No  CIWA:    COWS:     Musculoskeletal: Strength & Muscle Tone: within normal limits Gait & Station: normal Patient leans: N/A  Psychiatric Specialty Exam: Physical Exam  Nursing note and vitals reviewed. Constitutional: He is oriented to person, place, and time.  Neurological: He is alert and oriented to person, place, and time.    Review of Systems  Psychiatric/Behavioral: Positive for depression and substance abuse. Negative for hallucinations, memory loss and suicidal ideas. The patient is nervous/anxious. The patient does not have insomnia.   All other systems reviewed and are negative.   Blood pressure 128/79, pulse 62, temperature 98.4 F (36.9 C), temperature source Oral, resp. rate 16, height 5\' 8"  (1.727 m), weight 70.3 kg, SpO2  100 %.Body mass index is 23.57 kg/m.  General Appearance: Fairly Groomed  Eye Contact:  Fair  Speech:  Clear and Coherent and Normal Rate  Volume:  Normal  Mood:  Depressedminimizing   Affect:  Restricted  Thought Process:  Coherent, Goal Directed, Linear and Descriptions of Associations: Intact  Orientation:  Full (Time, Place, and Person)  Thought Content:  Logical  Suicidal Thoughts:  No  Homicidal Thoughts:  No  Memory:  Immediate;   Fair Recent;   Fair  Judgement:  Impaired  Insight:  Shallow  Psychomotor Activity:  Normal  Concentration:  Concentration: Fair and Attention Span: Fair  Recall:  FiservFair  Fund of Knowledge:  Fair  Language:  Good  Akathisia:  Negative  Handed:  Right  AIMS (if indicated):     Assets:  Communication Skills Desire for Improvement Resilience Social Support  ADL's:  Intact  Cognition:  WNL  Sleep:  Number of Hours: 4.75   Treatment Plan Summary: Daily contact with patient to assess and evaluate symptoms and progress in treatment.  - Continue inpatient hospitalization.  - Will continue today  10/09/2017 plan as below except where it is noted.  Medication management: Patient seems to be minimizing psychiatric conditions/symtpoms. He reports he does not want to take Zoloft or Neurontin any more for reasons as noted above. He shows no interest in wanting to start any other medications despite discussing benifts. He has asked that both medications are discontinued and is only open to therapy.   Will discontinue these medications as follows:  - Vistaril 25 mg po TID as needed for anxiety. -  Trazodone 50 mg po Q bedtime for insomnia.   CSW will continue to work on discharge disposition.   Other:  Safety: Will continue 15 minute observation for safety checks. Patient is able to contract for safety on the unit at this time  Labs:TSH, HgbA1c, lipid panel normal.  UDS positive for amphetamines and THC. HIV and Hep B and C are active.   Continue to develop treatment plan to decrease risk of relapse upon discharge and to reduce the need for readmission.  Psycho-social education regarding relapse prevention and self care.  Health care follow up as needed for medical problems.  Continue to attend and participate in therapy.   Armandina StammerAgnes Nwoko, NP, PMHNP, FNP-BC. 10/09/2017, 2:07 PMPatient ID: Leamon ArntAlex J Kilbourne, male   DOB: 1995/09/25, 22 y.o.   MRN: [id number] .Marland Kitchen.Agree with NP Progress Note

## 2017-10-09 NOTE — Progress Notes (Signed)
Writer observed patient lying in bed resting. He reported that he did not need anything and when writer informed him of medications available he declined. He did not attend group and was in the dayroom for a little while before retuning to his room to rest. Support given and safety maintained on unit with 15 min checks.

## 2017-10-09 NOTE — Plan of Care (Signed)
Pt progressing in the following metrics  D: pt found in bed resting. Pt did not have any medications ordered. Pt denies any si/hi/ah/vh and verbally agrees to approach staff if these become apparent. Pt denies any feelings of depression/hopelessness/anxiety, rating these all 0/10. Pt rates his pain at a 7/10 in his left foot. Pt states this is chronic pain and denied anything for this. Pt states his goal for today is to figure out discharge and will achieve this by asking when they can leave.  A: pt provided support and encouragement. Q3542m safety checks implemented and continued. R: pt safe on the unit. Will continue to monitor.   Problem: Education: Goal: Emotional status will improve Outcome: Progressing   Problem: Activity: Goal: Sleeping patterns will improve Outcome: Progressing   Problem: Coping: Goal: Ability to verbalize frustrations and anger appropriately will improve Outcome: Progressing Goal: Ability to demonstrate self-control will improve Outcome: Progressing   Problem: Health Behavior/Discharge Planning: Goal: Compliance with treatment plan for underlying cause of condition will improve Outcome: Progressing   Problem: Physical Regulation: Goal: Ability to maintain clinical measurements within normal limits will improve Outcome: Progressing   Problem: Safety: Goal: Periods of time without injury will increase Outcome: Progressing   Problem: Coping: Goal: Coping ability will improve Outcome: Progressing   Problem: Medication: Goal: Compliance with prescribed medication regimen will improve Outcome: Progressing   Problem: Health Behavior/Discharge Planning: Goal: Ability to remain free from injury will improve Outcome: Progressing

## 2017-10-09 NOTE — Progress Notes (Signed)
Recreation Therapy Notes  Date: 8.9.19 Time: 0930 Location: 300 Hall Dayroom  Group Topic: Stress Management  Goal Area(s) Addresses:  Patient will verbalize importance of using healthy stress management.  Patient will identify positive emotions associated with healthy stress management.   Intervention: Stress Management  Activity :  Guided Imagery.  LRT introduced the stress management technique of guided imagery.  LRT read a script to guide patients on mental vacation through a wildlife sanctuary.  Patients were to listen and follow along as LRT read script.  Education:  Stress Management, Discharge Planning.   Education Outcome: Acknowledges edcuation/In group clarification offered/Needs additional education  Clinical Observations/Feedback: Pt did not attend group.     Caroll RancherMarjette Fatima Fedie, LRT/CTRS         Lillia AbedLindsay, Manolito Jurewicz A 10/09/2017 11:06 AM

## 2017-10-09 NOTE — Progress Notes (Signed)
Psychoeducational Group Note  Date:  10/09/2017 Time:  2354  Group Topic/Focus:  Wrap-Up Group:   The focus of this group is to help patients review their daily goal of treatment and discuss progress on daily workbooks.  Participation Level: Did Not Attend  Participation Quality:  Not Applicable  Affect:  Not Applicable  Cognitive:  Not Applicable  Insight:  Not Applicable  Engagement in Group: Not Applicable  Additional Comments:  The patient did not attend group since he elected to return to his room rather than attend the group.   Hazle CocaGOODMAN, Reagen Haberman S 10/09/2017, 11:54 PM

## 2017-10-09 NOTE — Progress Notes (Signed)
D   Pt has been visible on the milieu and he attends and participates in groups   Pt is depressed   He hopes to get into long term rehab  A    Verbal support given   Medications offered    Q 15 min checks R    Pt is safe presently

## 2017-10-10 LAB — HEPATITIS B SURFACE ANTIGEN: HEP B S AG: NEGATIVE

## 2017-10-10 LAB — HEPATITIS C ANTIBODY: HCV Ab: 0.1 s/co ratio (ref 0.0–0.9)

## 2017-10-10 MED ORDER — HALOPERIDOL LACTATE 5 MG/ML IJ SOLN: 5.0000 mg | Freq: Four times a day (QID) | INTRAMUSCULAR | Status: DC | PRN

## 2017-10-10 MED ORDER — DULOXETINE HCL 30 MG PO CPEP
30.0000 mg | ORAL_CAPSULE | Freq: Every day | ORAL | Status: DC
  Administered 2017-10-10 – 2017-10-12 (×2): 30 mg via ORAL
  Filled 2017-10-10 (×5): qty 1

## 2017-10-10 MED ORDER — DIPHENHYDRAMINE HCL 50 MG/ML IJ SOLN: 50.0000 mg | Freq: Four times a day (QID) | INTRAMUSCULAR | Status: DC | PRN

## 2017-10-10 MED ORDER — HALOPERIDOL 5 MG PO TABS: 5.0000 mg | ORAL_TABLET | Freq: Four times a day (QID) | ORAL | Status: DC | PRN

## 2017-10-10 MED ORDER — DIPHENHYDRAMINE HCL 25 MG PO CAPS: 50.0000 mg | ORAL_CAPSULE | Freq: Four times a day (QID) | ORAL | Status: DC | PRN

## 2017-10-10 MED ORDER — LORAZEPAM 1 MG PO TABS: 2.0000 mg | ORAL_TABLET | Freq: Four times a day (QID) | ORAL | Status: DC | PRN

## 2017-10-10 MED ORDER — LORAZEPAM 2 MG/ML IJ SOLN: 2.0000 mg | Freq: Four times a day (QID) | INTRAMUSCULAR | Status: DC | PRN
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
## 2017-10-10 NOTE — [facility] Group Notes (Signed)
[facility] Group Notes:  (Nursing/MHT/Case Management/Adjunct)  Date:  10/10/2017  Time:  4:42 PM  Type of Therapy:  Psychoeducational Skills  Participation Level:  Did Not Attend  Participation Quality:  DID NOT ATTEND  Affect:  DID NOT ATTEND  Cognitive:  DID NOT ATTEND  Insight:  None  Engagement in Group:  DID NOT ATTEND  Modes of Intervention:  DID NOT ATTEND  Summary of Progress/Problems: Pt did not attend patient self inventory group.   Bethann PunchesJane O Loring Liskey 10/10/2017, 4:42 PM

## 2017-10-10 NOTE — Progress Notes (Signed)
Writer has observed patient up in the dayroom watching tv with minimal to no interaction with peers. Writer spoke with him 1:1 and he reported that he was fine, did not need or want any medicines. He was pleasant with his response. Support given and safety maintained on unit with 15 min checks.

## 2017-10-10 NOTE — Progress Notes (Signed)
Patient ID: Bradley Gibson, male   DOB: Oct 19, 1995, 22 y.o.   MRN: [id number]    D: Pt has been isolative and irritable on the unit today. Pt reported that he just wanted to be discharged. Pt has also refused to take any medication. Pt was seen by Feliz Beamravis NP, patient then agreed to take medication. After one dose of medication pt reported that he was not going to take anything else. Pt requested to fill out a 72 hour request for discharge, he reported that he was not IVC'ed. No IVC paperwork was on the chart, [facility] was IVC'ed paperwork was found and placed on the chart. Pt was informed that he was IVC'ed, he got upset and continued to report that he was ready to be discharged. Pt was offered medication, he refused. Pt reported that his depression was a 0, his hopelessness was a 0, his anxiety was a 0. Pt reported that his goal for today was to get released. Pt reported being negative SI/HI, no AH/VH noted. A: 15 min checks continued for patient safety. R: Pt safety maintained.

## 2017-10-10 NOTE — BHH Counselor (Signed)
Clinical Social Work Note  Met with pt at length at his request to discuss the problems that he has that prevent him from working.  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  He states he enjoyed his last job but lost it when he was caught putting toilet paper into his boot to decrease his pain, and he was told that the company could not handle the liability of him possibly being hurt and suing them.  He also reported to CSW that he reads poorly due to dyslexia, and while he was not diagnosed as a child with ADHD because his father does not believe in mental illness and would never let him get tested, he was always in trouble in school for not being able to pay attention and for always getting his letters mixed up, especially on short words.  The patient further reported asthma and not being able to afford an inhaler.  He stated he has had anger issues and has been diagnosed with Bipolar disorder, Depression, and Anxiety.  CSW looked up and gave to him the names of 4 local disability attorneys and encouraged him to seek professional help for his application since he has applied before but never followed through on appeals, does not know how to navigate the system and is such a poor reader that he does not really understand what letters he has gotten in the past from the [employer].  CSW also wrote down for him the list of things preventing him from working, as he stated he knew he needed to make a list but could not really do so.  We discussed at length that he does not have sufficient work credits to qualify for regular disability, so would be applying for SSI.  Patient made it very clear numerous times  that what he would really prefer would be to have a recommended surgery on his leg/ankle that would enable him to walk and stand normally and a recommended surgery on his shoulder that would enable him to use his arm regularly.  These would make him able to have active work and provide for his family, and that is what he would prefer as opposed to being on disability.  However, he has no insurance and cannot get any without working; therefore, this is a moot point.  CSW also informed him about applying for Medicaid through a local Department of Social Services.  He is staying in [place] with his mother and does not know whether it is on the [facility] or [facility] side.  He has missed a number of letters in the past from the [employer], and CSW encouraged him to talk to his mother about using her address as a permanent one to consistently get his important mail.  Finally, patient became very tearful in talking about not seeing his son for the last 2-1/2 years because the mother defied a court order for weekly visitation and took his son away, will not say where they are.  CSW encouraged him to talk about his feelings and gave him affirming messages about his grief.  He is concerned about paying his child support and CSW told him that he could make  arrangements for his child support to be paid out of his SSI.  Selmer Dominion, LCSW 10/10/2017, 4:32 PM

## 2017-10-10 NOTE — B[facility] Group Notes (Signed)
B[facility] Group Notes: (Clinical Social Work)   10/10/2017      Type of Therapy:  Group Therapy   Participation Level:  Did Not Attend despite MHT prompting   Ruby Dilone Grossman-Orr, LCSW 10/10/2017, 1:12 PM     

## 2017-10-10 NOTE — Progress Notes (Signed)
Adult Psychoeducational Group Note  Date:  10/10/2017 Time:  10:36 PM  Group Topic/Focus:  Wrap-Up Group:   The focus of this group is to help patients review their daily goal of treatment and discuss progress on daily workbooks.  Participation Level:  Active  Participation Quality:  Appropriate  Affect:  Appropriate  Cognitive:  Appropriate  Insight: Appropriate  Engagement in Group:  Engaged  Modes of Intervention:  Discussion  Additional Comments:  Patient attended group and participated.   Joal Eakle W Barby Colvard 10/10/2017, 10:36 PM

## 2017-10-11 NOTE — Progress Notes (Signed)
Patient did not attend the evening speaker AA meeting. Pt was notified that group was beginning but returned to his room.   

## 2017-10-11 NOTE — Progress Notes (Signed)
Writer observed patient earlier before AA meeting and he was up in the dayroom watching a movie. He reported that his day had ben good and he is hoping to discharge on tomorrow. He reports that he plans on contacting a disability lawyer once he returns home. He requested sleep medication for tonight which received. He is becoming a little more open and not as guarded as the previous nights. Support given and safety maintained on unit with 15 min checks.

## 2017-10-11 NOTE — Progress Notes (Signed)
[facility] MD Progress Note  10/11/2017 2:15 PM Bradley Gibson  MRN:  [id number]   Subjective: patient reports he is feeling better, and describes improved mood, feeling more hopeful and optimistic. States that he intends to apply for Medicaid , and once he has this insurance he willsee an orthopedist to  address his L foot issue ( describes history of clubbed foot, abnormally healed fracture in the past ) . States that " if  I get my foot right and hopefully I can walk normally that will make everything else better ". Currently denies suicidal ideations, denies medication side effects and states he is tolerating Cymbalta well . Denies hallucinations.   Objective:  I have discussed chart notes and have met with patient. 22 year old single male, lives with family, has a 32 year old son, presented after reporting suicidal ideations to police who had stopped him for erratic driving. Had been abusing Imethamphetamine ( reported recent IV use ) and cannabis . Patient currently presents with improving mood and range of affect and today does not present irritable or dysphoric. As above, reports his mood is better, and is more future oriented and hopeful.  Affect presents fuller in range . Denies medication side effects- on Cymbalta . We have discussed other medications, such as Neurontin, to address anxiety and assist with foot pain , but patient not currently wanting to add another medication. States " I am not really into meds, one is enough".  We have reviewed labs- Hep B, C and HIV negative. On unit has tended to be isolative, vaguely irritable, but today presenting with improved mood , more visible on unit, pleasant on approach. No disruptive or agitated behaviors.   Principal Problem: Major depressive disorder, recurrent episode, severe ([place]) Diagnosis:   Patient Active Problem List   Diagnosis Date Noted  . Major depressive disorder, recurrent episode, severe ([place]) [F33.2] 10/06/2017  . Substance  induced mood disorder ([place]) [F19.94] 10/06/2017   Total Time spent with patient: 20 minutes  Past Psychiatric History: See H&P  Past Medical History: History reviewed. No pertinent past medical history. History reviewed. No pertinent surgical history. Family History: History reviewed. No pertinent family history. Family Psychiatric  History: See H&P Social History:  Social History   Substance and Sexual Activity  Alcohol Use Not on file     Social History   Substance and Sexual Activity  Drug Use Not on file    Social History   Socioeconomic History  . Marital status: Single    Spouse name: Not on file  . Number of children: Not on file  . Years of education: Not on file  . Highest education level: Not on file  Occupational History  . Not on file  Social Needs  . Financial resource strain: Not on file  . Food insecurity:    Worry: Not on file    Inability: Not on file  . Transportation needs:    Medical: Not on file    Non-medical: Not on file  Tobacco Use  . Smoking status: Never Smoker  . Smokeless tobacco: Never Used  Substance and Sexual Activity  . Alcohol use: Not on file  . Drug use: Not on file  . Sexual activity: Not on file  Lifestyle  . Physical activity:    Days per week: Not on file    Minutes per session: Not on file  . Stress: Not on file  Relationships  . Social connections:    Talks on phone:  Not on file    Gets together: Not on file    Attends religious service: Not on file    Active member of club or organization: Not on file    Attends meetings of clubs or organizations: Not on file    Relationship status: Not on file  Other Topics Concern  . Not on file  Social History Narrative  . Not on file   Additional Social History:    Pain Medications: see MAR Prescriptions: see MAR Over the Counter: see MAR History of alcohol / drug use?: Yes Name of Substance 1: Methamphetamine, IV 1 - Amount (size/oz): 1/2 gram - gram 1 - Frequency:  2-3x weekly 1 - Last Use / Amount: 10/05/17 Name of Substance 2: THC 2 - Amount (size/oz): 1 joint  2 - Frequency: q d 2 - Last Use / Amount: 10/04/17   Sleep: Good  Appetite:  Good  Current Medications: Current Facility-Administered Medications  Medication Dose Route Frequency Provider Last Rate Last Dose  . acetaminophen (TYLENOL) tablet 650 mg  650 mg Oral Q6H PRN Ethelene Hal, NP      . alum & mag hydroxide-simeth (MAALOX/MYLANTA) 200-200-20 MG/5ML suspension 30 mL  30 mL Oral Q4H PRN Ethelene Hal, NP      . diphenhydrAMINE (BENADRYL) capsule 50 mg  50 mg Oral Q6H PRN Money, Lowry Ram, FNP       Or  . diphenhydrAMINE (BENADRYL) injection 50 mg  50 mg Intramuscular Q6H PRN Money, Lowry Ram, FNP      . DULoxetine (CYMBALTA) DR capsule 30 mg  30 mg Oral Daily Money, Lowry Ram, FNP   30 mg at 10/10/17 1307  . haloperidol (HALDOL) tablet 5 mg  5 mg Oral Q6H PRN Money, Lowry Ram, FNP       Or  . haloperidol lactate (HALDOL) injection 5 mg  5 mg Intramuscular Q6H PRN Money, Lowry Ram, FNP      . hydrOXYzine (ATARAX/VISTARIL) tablet 25 mg  25 mg Oral TID PRN Ethelene Hal, NP      . LORazepam (ATIVAN) tablet 2 mg  2 mg Oral Q6H PRN Money, Lowry Ram, FNP       Or  . LORazepam (ATIVAN) injection 2 mg  2 mg Intramuscular Q6H PRN Money, Darnelle Maffucci B, FNP      . magnesium hydroxide (MILK OF MAGNESIA) suspension 30 mL  30 mL Oral Daily PRN Ethelene Hal, NP      . pneumococcal 23 valent vaccine (PNU-IMMUNE) injection 0.5 mL  0.5 mL Intramuscular Tomorrow-1000 Marne Meline, Myer Peer, MD      . traZODone (DESYREL) tablet 50 mg  50 mg Oral QHS PRN Ethelene Hal, NP        Lab Results:  No results found for this or any previous visit (from the past 40 hour(s)).  Blood Alcohol level:  Lab Results  Component Value Date   ETH <10 88/91/6945    Metabolic Disorder Labs: Lab Results  Component Value Date   HGBA1C 4.9 10/07/2017   MPG 93.93 10/07/2017   No results  found for: PROLACTIN Lab Results  Component Value Date   CHOL 100 10/07/2017   TRIG 47 10/07/2017   HDL 42 10/07/2017   CHOLHDL 2.4 10/07/2017   VLDL 9 10/07/2017   LDLCALC 49 10/07/2017    Physical Findings: AIMS: Facial and Oral Movements Muscles of Facial Expression: None, normal Lips and Perioral Area: None, normal Jaw: None, normal Tongue: None, normal,Extremity Movements Upper (  arms, wrists, hands, fingers): None, normal Lower (legs, knees, ankles, toes): None, normal, Trunk Movements Neck, shoulders, hips: None, normal, Overall Severity Severity of abnormal movements (highest score from questions above): None, normal Incapacitation due to abnormal movements: None, normal Patient's awareness of abnormal movements (rate only patient's report): No Awareness, Dental Status Current problems with teeth and/or dentures?: No Does patient usually wear dentures?: No  CIWA:    COWS:     Musculoskeletal: Strength & Muscle Tone: within normal limits Gait & Station: normal Patient leans: N/A  Psychiatric Specialty Exam: Physical Exam  Nursing note and vitals reviewed. Constitutional: He is oriented to person, place, and time. He appears well-developed and well-nourished.  Cardiovascular: Normal rate.  Respiratory: Effort normal.  Musculoskeletal: Normal range of motion.  Neurological: He is alert and oriented to person, place, and time.  Skin: Skin is warm.    Review of Systems  Constitutional: Negative.   HENT: Negative.   Eyes: Negative.   Respiratory: Negative.   Cardiovascular: Negative.   Gastrointestinal: Negative.   Genitourinary: Negative.   Musculoskeletal: Negative.   Skin: Negative.   Neurological: Negative.   Endo/Heme/Allergies: Negative.   Psychiatric/Behavioral: Positive for depression and substance abuse. Negative for hallucinations and suicidal ideas.  no chest pain, no shortness of breath, no vomiting, chronic L foot pain and affected gait/mobility   Blood pressure 111/74, pulse 86, temperature 98.3 F (36.8 C), resp. rate 18, height _0  (1.727 m), weight 70.3 kg, SpO2 100 %.Body mass index is 23.57 kg/m.  General Appearance: improving grooming   Eye Contact:  Good  Speech:  Normal Rate  Volume:  Normal  Mood:  improving mood  Affect:  improving range of affect, brighter, not irritable at this time  Thought Process:  Linear and Descriptions of Associations: Intact  Orientation:  Full (Time, Place, and Person)  Thought Content:  no hallucinations,no delusions, not internally preoccupied   Suicidal Thoughts:  No currently denies suicidal or self injurious ideations, and presents future oriented   Homicidal Thoughts:  No  Memory:  recent and remote grossly intact   Judgement:  Other:  improving   Insight:  fair- improving   Psychomotor Activity:  Normal  Concentration:  Concentration: Good and Attention Span: Good  Recall:  Good  Fund of Knowledge:  Good  Language:  Good  Akathisia:  No  Handed:  Right  AIMS (if indicated):     Assets:  Communication Skills Desire for Improvement Financial Resources/Insurance Housing Physical Health Transportation  ADL's:  Intact  Cognition:  WNL  Sleep:  Number of Hours: 6.25   Assessment - 22 year old male, presented after reporting suicidal ideations, self burning on forearm, methamphetamine and cannabis abuse . Initially presented dysphoric and vaguely irritable. Mood has partially/gradually improved and he states he is now feeling significantly better. Presents with brighter affect, not currently irritable and future oriented . Tolerating Cymbalta well thus far .  Treatment Plan Summary: Treatment Plan reviewed as below today 8/11  Daily contact with patient to assess and evaluate symptoms and progress in treatment, Medication management and Plan is to:  Encourage group and milieu participation to work on coping skills and symptom reduction Encourage efforts to work on sobriety and  relapse prevention Treatment Team working on disposition planning  Continue Cymbalta 30 mg QDAY for depression, anxiety,pain Continue Vistaril 25 mg Q 8 hours PRN for anxiety as needed  Continue Trazodone 50 mgrs QHS PRN for insomnia  On Haldol/Ativan for agitation protocol as needed  Jenne Campus, MD 10/11/2017, 2:15 PM   Patient ID: [name], male   DOB: 11-15-1995, 22 y.o.   MRN: [id number]

## 2017-10-11 NOTE — BHH Group Notes (Signed)
BHH Group Notes: (Clinical Social Work)   10/11/2017      Type of Therapy:  Group Therapy   Participation Level:  Did Not Attend despite MHT prompting   Ambrose MantleMareida Grossman-Orr, LCSW 10/11/2017, 1:41 PM

## 2017-10-11 NOTE — BHH Group Notes (Signed)
Adult Psychoeducational Group Note  Date:  10/11/2017 Time:  1:00 PM  Group Topic/Focus: Life Skills Facilitator: Patty D., RN  Participation Level:  Active  Participation Quality:  Appropriate  Affect:  Appropriate  Cognitive:  Alert and Oriented  Insight: Improving  Engagement in Group:  Developing/Improving  Modes of Intervention:  Discussion and Education  Additional Comments:    Wally Shevchenko A Emmaleigh Longo 10/11/2017, 2:00 PM 

## 2017-10-12 MED ORDER — TRAZODONE HCL 50 MG PO TABS: 50.0000 mg | ORAL_TABLET | Freq: Every evening | ORAL | 0 refills | Status: AC | PRN

## 2017-10-12 MED ORDER — DULOXETINE HCL 20 MG PO CPEP
40.0000 mg | ORAL_CAPSULE | Freq: Every day | ORAL | Status: DC
  Filled 2017-10-12 (×2): qty 14

## 2017-10-12 MED ORDER — TRAZODONE HCL 50 MG PO TABS
50.0000 mg | ORAL_TABLET | Freq: Once | ORAL | Status: AC
  Administered 2017-10-12: 50 mg via ORAL
  Filled 2017-10-12: qty 1

## 2017-10-12 MED ORDER — DULOXETINE HCL 40 MG PO CPEP: 40.0000 mg | ORAL_CAPSULE | Freq: Every day | ORAL | 0 refills | Status: AC

## 2017-10-12 NOTE — [facility] Suicide Risk Assessment (Signed)
[facility]   Principal Problem: depression, substance abuse  Discharge Diagnoses:  Patient Active Problem List   Diagnosis Date Noted  . Major depressive disorder, recurrent episode, severe ([facility]) [F33.2] 10/06/2017  . Substance induced mood disorder ([facility]) [F19.94] 10/06/2017    Total Time spent with patient: 30 minutes  Musculoskeletal: Strength & Muscle Tone: within normal limits Gait & Station: normal Patient leans: N/A  Psychiatric Specialty Exam: ROS no headache, no chest pain, no vomiting, chronic left foot pain  Blood pressure 111/74, pulse 86, temperature 98.3 F (36.8 C), resp. rate 18, height 5\' 8"  (1.727 m), weight 70.3 kg, SpO2 100 %.Body mass index is 23.57 kg/m.  General Appearance: Well Groomed  Eye Contact::  Good  Speech:  Normal Rate409  Volume:  Normal  Mood:  reports improved mood and presents euthymic today, affect is more reactive  Affect:  brighter, reactive  Thought Process:  Linear and Descriptions of Associations: Intact  Orientation:  Full (Time, Place, and Person)  Thought Content:  no hallucinations, no delusions, not internally preoccupied   Suicidal Thoughts:  No denies any suicidal or self injurious ideations,denies homicidal or violent ideations  Homicidal Thoughts:  No  Memory:  recent and remote grossly intact   Judgement:  Other:  improving   Insight:  improving   Psychomotor Activity:  Normal  Concentration:  Good  Recall:  Good  Fund of Knowledge:Good  Language: Good  Akathisia:  Negative  Handed:  Right  AIMS (if indicated):     Assets:  Communication Skills Resilience  Sleep:  Number of Hours: 5.25  Cognition: WNL  ADL's:  Intact   Mental Status Per Nursing Assessment::   On Admission:  Suicidal ideation indicated by patient, Self-harm thoughts, Self-harm behaviors, Suicide plan, Intention to act on suicide plan  Demographic Factors:  22 year old male, single , currently living with mother , has  one son who lives with the mother   Loss Factors: Not being able to see his son , unemployment,  chronic foot pain/gait difficulties , substance abuse   Historical Factors: One prior psychiatric admission in 2014, history of substance abuse , history of mood disorder , history of self burning   Risk Reduction Factors:   Sense of responsibility to family, Living with another person, especially a relative and Positive coping skills or problem solving skills  Continued Clinical Symptoms:  At this time patient is alert, attentive, well related, better groomed, mood is improved compared to admission, affect is reactive, brighter,no longer irritable, no thought disorder , no suicidal or self injurious ideations, no homicidal or violent ideations, no psychotic symptoms, future oriented, reports he is feeling more hopeful , currently states he is motivated in applying for Medicaid and then get elective surgery to address foot pain. States he feels motivated in maintaining sobriety.  Behavior on unit in good control, pleasant on approach No medication side effects. On Cymbalta which will be titrated further to 40 mgrs QDAY prior to discharge. Side effects discussed    Cognitive Features That Contribute To Risk:  No gross cognitive deficits noted upon discharge. Is alert , attentive, and oriented x 3   Suicide Risk:  Mild:  Suicidal ideation of limited frequency, intensity, duration, and specificity.  There are no identifiable plans, no associated intent, mild dysphoria and related symptoms, good self-control (both objective and subjective assessment), few other risk factors, and identifiable protective factors, including available and accessible social support.  Follow-up Information  [facility].   Why:  Appointment for medication management and therapy services is Thursday, 10/15/17 at 9:45am. Please be sure to bring your Photo ID, SS card, any proof of  household income, and any discharge paperwork from this hospitalization.  Contact information: [address] [place] [id number] [phone]           Plan Of Care/Follow-up recommendations:  Activity:  as tolerated  Diet:  regular Tests:  NA Other:  See below  Patient is expressing readiness for discharge and is leaving unit in good spirits Plans to return to live with mother Follow up as above   Craige CottaFernando A Leisl Spurrier, MD 10/12/2017, 9:26 AM

## 2017-10-12 NOTE — Tx Team (Signed)
Interdisciplinary Treatment and Diagnostic Plan Update  10/12/2017 Time of Session: 10:40am Bradley Gibson MRN: [id number]  Principal Diagnosis: Major depressive disorder, recurrent episode, severe ([place])  Secondary Diagnoses: Principal Problem:   Major depressive disorder, recurrent episode, severe ([place]) Active Problems:   Substance induced mood disorder ([place])   Current Medications:  Current Facility-Administered Medications  Medication Dose Route Frequency Provider Last Rate Last Dose  . acetaminophen (TYLENOL) tablet 650 mg  650 mg Oral Q6H PRN Ethelene Hal, NP      . alum & mag hydroxide-simeth (MAALOX/MYLANTA) 200-200-20 MG/5ML suspension 30 mL  30 mL Oral Q4H PRN Ethelene Hal, NP      . diphenhydrAMINE (BENADRYL) capsule 50 mg  50 mg Oral Q6H PRN Money, Lowry Ram, FNP       Or  . diphenhydrAMINE (BENADRYL) injection 50 mg  50 mg Intramuscular Q6H PRN Money, Lowry Ram, FNP      . DULoxetine (CYMBALTA) DR capsule 30 mg  30 mg Oral Daily Money, Lowry Ram, FNP   30 mg at 10/10/17 1307  . haloperidol (HALDOL) tablet 5 mg  5 mg Oral Q6H PRN Money, Lowry Ram, FNP       Or  . haloperidol lactate (HALDOL) injection 5 mg  5 mg Intramuscular Q6H PRN Money, Lowry Ram, FNP      . hydrOXYzine (ATARAX/VISTARIL) tablet 25 mg  25 mg Oral TID PRN Ethelene Hal, NP      . LORazepam (ATIVAN) tablet 2 mg  2 mg Oral Q6H PRN Money, Lowry Ram, FNP       Or  . LORazepam (ATIVAN) injection 2 mg  2 mg Intramuscular Q6H PRN Money, Darnelle Maffucci B, FNP      . magnesium hydroxide (MILK OF MAGNESIA) suspension 30 mL  30 mL Oral Daily PRN Ethelene Hal, NP      . pneumococcal 23 valent vaccine (PNU-IMMUNE) injection 0.5 mL  0.5 mL Intramuscular Tomorrow-1000 Cobos, Fernando A, MD      . traZODone (DESYREL) tablet 50 mg  50 mg Oral QHS PRN Ethelene Hal, NP   50 mg at 10/11/17 2059   PTA Medications: Medications Prior to Admission  Medication Sig Dispense Refill Last Dose  .  albuterol (PROVENTIL HFA) 108 (90 Base) MCG/ACT inhaler    Taking  . cyclobenzaprine (FLEXERIL) 10 MG tablet Take 1 tablet (10 mg total) by mouth 3 (three) times daily as needed for muscle spasms. (Patient not taking: Reported on 10/06/2017) 30 tablet 0 Not Taking at Unknown time    Patient Stressors: Financial difficulties Health problems Marital or family conflict Occupational concerns Substance abuse  Patient Strengths: Ability for insight Capable of independent living Curator fund of knowledge Motivation for treatment/growth  Treatment Modalities: Medication Management, Group therapy, Case management,  1 to 1 session with clinician, Psychoeducation, Recreational therapy.   Physician Treatment Plan for Primary Diagnosis: Major depressive disorder, recurrent episode, severe ([place]) Long Term Goal(s): Improvement in symptoms so as ready for discharge Improvement in symptoms so as ready for discharge   Short Term Goals: Ability to identify changes in lifestyle to reduce recurrence of condition will improve Ability to verbalize feelings will improve Ability to disclose and discuss suicidal ideas Compliance with prescribed medications will improve Ability to identify triggers associated with substance abuse/mental health issues will improve Ability to disclose and discuss suicidal ideas Ability to demonstrate self-control will improve Ability to identify and develop effective coping behaviors will improve Ability to maintain clinical  measurements within normal limits will improve  Medication Management: Evaluate patient's response, side effects, and tolerance of medication regimen.  Therapeutic Interventions: 1 to 1 sessions, Unit Group sessions and Medication administration.  Evaluation of Outcomes: Not Met  Physician Treatment Plan for Secondary Diagnosis: Principal Problem:   Major depressive disorder, recurrent episode, severe ([place]) Active Problems:    Substance induced mood disorder ([place])  Long Term Goal(s): Improvement in symptoms so as ready for discharge Improvement in symptoms so as ready for discharge   Short Term Goals: Ability to identify changes in lifestyle to reduce recurrence of condition will improve Ability to verbalize feelings will improve Ability to disclose and discuss suicidal ideas Compliance with prescribed medications will improve Ability to identify triggers associated with substance abuse/mental health issues will improve Ability to disclose and discuss suicidal ideas Ability to demonstrate self-control will improve Ability to identify and develop effective coping behaviors will improve Ability to maintain clinical measurements within normal limits will improve     Medication Management: Evaluate patient's response, side effects, and tolerance of medication regimen.  Therapeutic Interventions: 1 to 1 sessions, Unit Group sessions and Medication administration.  Evaluation of Outcomes: Adequate for discharge  RN Treatment Plan for Primary Diagnosis: Major depressive disorder, recurrent episode, severe ([place]) Long Term Goal(s): Knowledge of disease and therapeutic regimen to maintain health will improve  Short Term Goals: Ability to participate in decision making will improve, Ability to verbalize feelings will improve, Ability to identify and develop effective coping behaviors will improve and Compliance with prescribed medications will improve  Medication Management: RN will administer medications as ordered by provider, will assess and evaluate patient's response and provide education to patient for prescribed medication. RN will report any adverse and/or side effects to prescribing provider.  Therapeutic Interventions: 1 on 1 counseling sessions, Psychoeducation, Medication administration, Evaluate responses to treatment, Monitor vital signs and CBGs as ordered, Perform/monitor CIWA, COWS, AIMS and Fall Risk  screenings as ordered, Perform wound care treatments as ordered.  Evaluation of Outcomes: Adequate for discharge   LCSW Treatment Plan for Primary Diagnosis: Major depressive disorder, recurrent episode, severe ([place]) Long Term Goal(s): Safe transition to appropriate next level of care at discharge, Engage patient in therapeutic group addressing interpersonal concerns.  Short Term Goals: Engage patient in aftercare planning with referrals and resources  Therapeutic Interventions: Assess for all discharge needs, 1 to 1 time with Social worker, Explore available resources and support systems, Assess for adequacy in community support network, Educate family and significant other(s) on suicide prevention, Complete Psychosocial Assessment, Interpersonal group therapy.  Evaluation of Outcomes: Adequate for discharge  Progress in Treatment: Attending groups: Yes Participating in groups: Yes Taking medication as prescribed: Yes. Toleration medication: Yes. Family/Significant other contact made: SPE completed with pt; pt declined to consent to collateral contact.  Patient understands diagnosis: Yes. Discussing patient identified problems/goals with staff: Yes. Medical problems stabilized or resolved: Yes. Denies suicidal/homicidal ideation: Yes. Issues/concerns per patient self-inventory: No. Other:   New problem(s) identified: None   New Short Term/Long Term Goal(s): Detox, medication stabilization, elimination of SI thoughts, development of comprehensive mental wellness plan.   Patient Goals:  To learn skills to connect with people and to learn healthy copings skills to battle addiction  Discharge Plan or Barriers: Pt has follow-up appt scheduled with [facility]. [facility] pamphlet, Mobile Crisis information, and AA/NA information provided to patient for additional community support and resources.   Reason for Continuation of Hospitalization: none  Estimated Length of Stay: Monday,  8/12  Attendees: Patient: 10/12/2017 9:11 AM  Physician: Dr. Neita Garnet, MD 10/12/2017 9:11 AM  Nursing: Demaris Callander; Mateo Flow RN 10/12/2017 9:11 AM  RN Care Manager: Rhunette Croft 10/12/2017 9:11 AM  Social Worker: Janice Norrie LCSW 10/12/2017 9:11 AM  Recreational Therapist: x 10/12/2017 9:11 AM  Other: Ricky Ala NP 10/12/2017 9:11 AM  Other: X 10/12/2017 9:11 AM  Other:X 10/12/2017 9:11 AM    Scribe for Treatment Team: Avelina Laine, LCSW 10/12/2017 9:11 AM

## 2017-10-12 NOTE — Progress Notes (Signed)
  [facility] Discharge Plan :  Will you be returning to the same living situation after discharge:  Yes,  patient reports he is returning home to live with his mother and family At discharge, do you have transportation home?: Yes,  bus pass, PART bus pass Do you have the ability to pay for your medications: No.  Release of information consent forms completed and in the chart;  Patient's signature needed at discharge.  Patient to Follow up at: Follow-up Information    [facility] Go on 10/15/2017.   Why:  Appointment for medication management and therapy services is Thursday, 10/15/17 at 9:45am. Please be sure to bring your Photo ID, SS card, any proof of household income, and any discharge paperwork from this hospitalization.  Contact information: [address] [place] [address] [phone]           Next level of care provider has access to [facility] Link:yes  Safety Planning and Suicide Prevention discussed: Yes,  with the patient     Has patient been referred to the Quitline?: Patient refused referral  Patient has been referred for addiction treatment: Pt. refused referral  Maeola SarahJolan E Myrikal Messmer, LCSWA 10/12/2017, 10:32 AM
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
## 2017-10-12 NOTE — Progress Notes (Signed)
D. Pt presents with flat affect, brightening upon interactions- pt reports improving mood exhibiting calm and cooperative  Behavior- observed interacting appropriately with peers in dayroom. Pt currently denies SI/HI and AV hallucinations. A. Labs and vitals monitored. Pt compliant with medications. Pt supported emotionally and encouraged to express concerns and ask questions.   R. Pt remains safe with 15 minute checks. Will continue POC.

## 2017-10-12 NOTE — Progress Notes (Signed)
Pt discharged to lobby. Pt was stable and appreciative at that time. Pt given bus ticket. All papers and prescriptions, samples and were given and valuables returned. Verbal understanding expressed. Denies SI/HI and A/VH. Pt given opportunity to express concerns and ask questions.

## 2020-06-08 ENCOUNTER — Emergency Department (HOSPITAL_COMMUNITY): Payer: Self-pay

## 2020-06-08 ENCOUNTER — Emergency Department (HOSPITAL_COMMUNITY)
Admission: EM | Admit: 2020-06-08 | Discharge: 2020-06-08 | Disposition: A | Discharge status: Home / Self Care | Payer: Self-pay | Attending: Emergency Medicine | Admitting: Emergency Medicine

## 2020-06-08 ENCOUNTER — Other Ambulatory Visit: Payer: Self-pay

## 2020-06-08 DIAGNOSIS — W108XXA Fall (on) (from) other stairs and steps, initial encounter: Secondary | ICD-10-CM | POA: Insufficient documentation

## 2020-06-08 DIAGNOSIS — I10 Essential (primary) hypertension: Secondary | ICD-10-CM | POA: Insufficient documentation

## 2020-06-08 DIAGNOSIS — S62315A Displaced fracture of base of fourth metacarpal bone, left hand, initial encounter for closed fracture: Secondary | ICD-10-CM | POA: Insufficient documentation

## 2020-06-08 MED ORDER — OXYCODONE-ACETAMINOPHEN 5-325 MG PO TABS
1.0000 | ORAL_TABLET | Freq: Once | ORAL | Status: AC
  Administered 2020-06-08: 1 via ORAL
  Filled 2020-06-08: qty 1

## 2020-06-08 MED ORDER — ACETAMINOPHEN 500 MG PO TABS: 1000.0000 mg | ORAL_TABLET | Freq: Once | ORAL | Status: DC

## 2020-06-08 NOTE — Progress Notes (Signed)
Orthopedic Tech Progress Note Patient Details:  Bradley Gibson 11-26-1995 [id number]  Ortho Devices Type of Ortho Device: Ulna gutter splint Ortho Device/Splint Location: LUE Ortho Device/Splint Interventions: Ordered,Application,Adjustment   Post Interventions Patient Tolerated: Well Instructions Provided: Care of device,Poper ambulation with device   [name] 06/08/2020, 4:16 PM

## 2020-06-08 NOTE — ED Provider Notes (Signed)
[facility] EMERGENCY DEPARTMENT Provider Note   CSN: [id number] Arrival date & time: 06/08/20  1328     History No chief complaint on file.   Bradley Gibson is a 25 y.o. male who presents with concern for left wrist and hand pain after he fell down stairs today.  He states that he tripped going up wooden stairs and when he did so his left hand went through the slots in the back of the stairs.  He endorses tingling sensation in the dorsum of his left hand but denies any numbness.  Endorses movement in his fingers but significant pain when he does so.  Endorses some soreness in his left elbow and his left shoulder but has full range of motion in those areas.  I personally reviewed this patient's medical records.  He has history of polysubstance abuse and MDD.  Endorses utilization of marijuana "all day every day".   HPI     No past medical history on file.  Patient Active Problem List   Diagnosis Date Noted  . Major depressive disorder, recurrent episode, severe (HCC) 10/06/2017  . Substance induced mood disorder (HCC) 10/06/2017    No past surgical history on file.     No family history on file.  Social History   Tobacco Use  . Smoking status: Never Smoker  . Smokeless tobacco: Never Used    Home Medications Prior to Admission medications   Medication Sig Start Date End Date Taking? Authorizing Provider  albuterol (PROVENTIL HFA) 108 (90 Base) MCG/ACT inhaler  06/17/13   [provider]  DULoxetine 40 MG CPEP Take 40 mg by mouth daily. 10/13/17   Oneta Rack, NP  traZODone (DESYREL) 50 MG tablet Take 1 tablet (50 mg total) by mouth at bedtime as needed for sleep. 10/12/17   Oneta Rack, NP    Allergies    Patient has no known allergies.  Review of Systems   Review of Systems  Constitutional: Negative.   HENT: Negative.   Respiratory: Negative.   Cardiovascular: Negative.   Gastrointestinal: Negative.   Musculoskeletal: Positive  for arthralgias, joint swelling and myalgias.    Physical Exam Updated Vital Signs BP (!) 126/94 (BP Location: Right Arm)   Pulse 61   Temp 98.4 F (36.9 C) (Oral)   Resp 17   SpO2 99%   Physical Exam Vitals and nursing note reviewed.  HENT:     Head: Normocephalic and atraumatic.  Eyes:     General: No scleral icterus.       Right eye: No discharge.        Left eye: No discharge.     Conjunctiva/sclera: Conjunctivae normal.     Pupils: Pupils are equal, round, and reactive to light.  Cardiovascular:     Rate and Rhythm: Normal rate.     Pulses: Normal pulses.  Pulmonary:     Effort: Pulmonary effort is normal.  Musculoskeletal:     Right shoulder: Normal.     Left shoulder: Normal.     Right upper arm: Normal.     Left upper arm: Normal.     Right elbow: Normal.     Left elbow: No swelling or deformity. Normal range of motion. Tenderness present.     Right forearm: Normal.     Left forearm: Normal.     Right wrist: Normal.     Left wrist: Tenderness and bony tenderness present. No swelling, deformity, snuff box tenderness or crepitus. Decreased range  of motion. Normal pulse.     Right hand: Normal.     Left hand: Swelling, tenderness and bony tenderness present. Decreased range of motion. Normal capillary refill.       Arms:       Hands:     Comments: Normal cap refill in all 5 digits of the left hand, normal radial pulse, full sensation in all 5 digits and in the dorsum and volar aspects of the hand.  Grip strength not assessed due to patient's pain  Skin:    General: Skin is warm and dry.     Capillary Refill: Capillary refill takes less than 2 seconds.  Neurological:     General: No focal deficit present.     Mental Status: He is alert and oriented to person, place, and time.     Motor: Motor function is intact.  Psychiatric:        Mood and Affect: Mood normal.     ED Results / Procedures / Treatments   Labs (all labs ordered are listed, but only  abnormal results are displayed) Labs Reviewed - No data to display  EKG None  Radiology DG Forearm Left  Result Date: 06/08/2020 CLINICAL DATA:  Left wrist pain after fall down stairs. EXAM: LEFT FOREARM - 2 VIEW COMPARISON:  None. FINDINGS: There is no evidence of fracture or other focal bone lesions. Soft tissues are unremarkable. IMPRESSION: Negative. Electronically Signed   By: Lupita Raider M.D.   On: 06/08/2020 15:09   DG Wrist Complete Left  Result Date: 06/08/2020 CLINICAL DATA:  Left wrist pain after fall. EXAM: LEFT WRIST - COMPLETE 3+ VIEW COMPARISON:  None. FINDINGS: Mildly displaced oblique fracture is seen involving the fourth metacarpal. Joint spaces are intact. No soft tissue abnormality is noted. IMPRESSION: Mildly displaced fourth metacarpal fracture. Electronically Signed   By: Lupita Raider M.D.   On: 06/08/2020 15:08    Procedures Procedures   Medications Ordered in ED Medications  oxyCODONE-acetaminophen (PERCOCET/ROXICET) 5-325 MG per tablet 1 tablet (1 tablet Oral Given 06/08/20 1608)    ED Course  I have reviewed the triage vital signs and the nursing notes.  Pertinent labs & imaging results that were available during my care of the patient were reviewed by me and considered in my medical decision making (see chart for details).  Clinical Course as of 06/08/20 1641  Fri Jun 08, 2020  1528 Consult to ortho APP, Charma Igo, PA-C, who recommends ulnar gutter splint and follow up with Dr. Izora Ribas in the office next week. I appreciate his collaboration in the care of this patient.  [RS]    Clinical Course User Index [RS] Reesha Debes, Eugene Gavia, PA-C   MDM Rules/Calculators/A&P                         25 year old male presents with concern for left hand pain after he fell down stairs today.  No obvious deformity.  DDX includes but is not limited to wrist sprain, contusion, fracture, dislocation.   Mildly hypertensive on intake, vital signs otherwise  normal.  Physical exam revealed tenderness to palpation over the dorsum of the left wrist without deformity or crepitus.  Patient able to range the wrist, but decreased range of motion due to pain.  Exquisite tenderness palpation over the left fourth and fifth metacarpals without obvious deformity or crepitus.  Normal cap refill and sensation in all 5 digits of the left hand.  Plain film  obtained in triage revealed mildly displaced fracture of the fourth left metacarpal.  No evidence of fracture dislocation in the forearm.  Consult to orthopedic PA as above, will place patient in ulnar gutter splint.  Analgesia offered.  No further work is warranted in ED as time.  Normal cap refill and sensation in all 5 digits of left hand after ulnar gutter splint was placed.  Patient tolerated the procedure well.  Nisaiah voiced understanding of his smedical evaluation and treatment plan.  Each of his questions answered to her expressed satisfaction.  Return precautions given.  Patient is well-appearing, stable, and appropriate for discharge at this time, he is agreeable to follow-up with Dr. Izora Ribas, hand surgeon, in the office next week.  This chart was dictated using voice recognition software, Dragon. Despite the best efforts of this provider to proofread and correct errors, errors may still occur which can change documentation meaning.  Final Clinical Impression(s) / ED Diagnoses Final diagnoses:  Displaced fracture of base of fourth metacarpal bone, left hand, initial encounter for closed fracture    Rx / DC Orders ED Discharge Orders    None       Sherrilee Gilles 06/08/20 1641    Terald Sleeper, MD 06/09/20 0111

## 2020-06-08 NOTE — ED Triage Notes (Signed)
Pt here after falling down some steps , pt is c/o left wrist pain , fingers are cool to touch

## 2020-06-08 NOTE — ED Triage Notes (Signed)
Emergency Medicine Provider Triage Evaluation Note  Bradley Gibson , a 25 y.o. male  was evaluated in triage.  Pt complains of left wrist pain.  Fell down steps 1.5hrs prior.  Fell down 4 steps.  No loc, denies hitting head head.  No head, neck, or back pain.    Patient is right hand dominate.    Endorses marijuana use today.    Review of Systems  Positive: Left wrist pain Negative:   Physical Exam  BP (!) 126/93 (BP Location: Right Arm)   Pulse 68   Temp 98.5 F (36.9 C)   Resp 18   SpO2 97%  Gen:   Awake, no distress  HEENT:  Atraumatic  Resp:  Normal effort Cardiac:  Normal rate, +3 left radial pulse  MSK:   Right wrist pain, dec ROM d/t complaints of pain.  cap refill <2 in all digits of left hand. no midline tenderness or deformity to cervical, thoracic or lumbar spine,  Neuro:  Speech clear   Medical Decision Making  Medically screening exam initiated at 2:00 PM.  Appropriate orders placed.  Leamon Arnt was informed that the remainder of the evaluation will be completed by another provider, this initial triage assessment does not replace that evaluation, and the importance of remaining in the ED until their evaluation is complete.  Clinical Impression   Left wrist pain after fall.  The patient appears stable so that the remainder of the work up may be completed by another provider.     Haskel Schroeder, [place] 06/08/20 1418

## 2020-06-08 NOTE — Discharge Instructions (Addendum)
You were seen in the ER today for your left hand pain after your fall.  Unfortunately you broke a bone in your hand called a metacarpal bone, which is the bone in your hand above your left ring finger.  You were placed in a splint which should leave in place until you follow-up with a hand surgeon the office.  Below is the contact information for Dr. Debby Bud office, this is the hand surgeon with whom you should follow-up next week.  It is important you follow-up with him for further evaluation to develop the best treatment plan to ensure you do not have any prolonged complications from your injury today.  You may take Tylenol and ibuprofen as needed at home for your pain, alternating every 3 hours.  Return to the ER if you develop any numbness, tingling in your fingers, or any other new severe symptoms.

## 2020-07-06 ENCOUNTER — Other Ambulatory Visit: Payer: Self-pay

## 2020-07-06 ENCOUNTER — Emergency Department (HOSPITAL_COMMUNITY)
Admission: EM | Admit: 2020-07-06 | Discharge: 2020-07-06 | Disposition: A | Discharge status: Home / Self Care | Payer: Self-pay | Attending: Emergency Medicine | Admitting: Emergency Medicine

## 2020-07-06 DIAGNOSIS — J45901 Unspecified asthma with (acute) exacerbation: Secondary | ICD-10-CM | POA: Insufficient documentation

## 2020-07-06 MED ORDER — PREDNISONE 20 MG PO TABS: 40.0000 mg | ORAL_TABLET | Freq: Every day | ORAL | 0 refills | Status: AC

## 2020-07-06 MED ORDER — ALBUTEROL SULFATE HFA 108 (90 BASE) MCG/ACT IN AERS
2.0000 | INHALATION_SPRAY | RESPIRATORY_TRACT | Status: DC | PRN
  Administered 2020-07-06: 2 via RESPIRATORY_TRACT
  Filled 2020-07-06: qty 6.7

## 2020-07-06 NOTE — ED Triage Notes (Signed)
Ran out of inhaler and having problem with asthma. Denies any fever and chills.

## 2020-07-06 NOTE — ED Provider Notes (Signed)
[facility] EMERGENCY DEPARTMENT Provider Note   CSN: [id number] Arrival date & time: 07/06/20  0034     History No chief complaint on file.   Bradley Gibson is a 25 y.o. male.  Patient presents to the emergency department with a chief complaint of shortness of breath and wheezing.  He has history of asthma.  He states that he ran out of his inhaler today.  He states that he normally has to use it daily, and especially before going to bed at night.  He denies any fevers or chills.  Denies any new productive cough.  Denies any other associated symptoms.  The history is provided by the patient. No language interpreter was used.       No past medical history on file.  Patient Active Problem List   Diagnosis Date Noted  . Major depressive disorder, recurrent episode, severe (HCC) 10/06/2017  . Substance induced mood disorder (HCC) 10/06/2017    No past surgical history on file.     No family history on file.  Social History   Tobacco Use  . Smoking status: Never Smoker  . Smokeless tobacco: Never Used    Home Medications Prior to Admission medications   Medication Sig Start Date End Date Taking? Authorizing Provider  albuterol (PROVENTIL HFA) 108 (90 Base) MCG/ACT inhaler  06/17/13   [provider]  DULoxetine 40 MG CPEP Take 40 mg by mouth daily. 10/13/17   Oneta Rack, NP  traZODone (DESYREL) 50 MG tablet Take 1 tablet (50 mg total) by mouth at bedtime as needed for sleep. 10/12/17   Oneta Rack, NP    Allergies    Patient has no known allergies.  Review of Systems   Review of Systems  All other systems reviewed and are negative.   Physical Exam Updated Vital Signs BP (!) 123/96 (BP Location: Left Arm)   Pulse (!) 56   Temp 98.4 F (36.9 C)   Resp 20   SpO2 99%   Physical Exam Vitals and nursing note reviewed.  Constitutional:      Appearance: He is well-developed.  HENT:     Head: Normocephalic and atraumatic.   Eyes:     Conjunctiva/sclera: Conjunctivae normal.  Cardiovascular:     Rate and Rhythm: Normal rate and regular rhythm.     Heart sounds: No murmur heard.   Pulmonary:     Effort: Pulmonary effort is normal. No respiratory distress.     Breath sounds: Wheezing present.  Abdominal:     Palpations: Abdomen is soft.     Tenderness: There is no abdominal tenderness.  Musculoskeletal:        General: Normal range of motion.     Cervical back: Neck supple.  Skin:    General: Skin is warm and dry.  Neurological:     Mental Status: He is alert and oriented to person, place, and time.  Psychiatric:        Mood and Affect: Mood normal.        Behavior: Behavior normal.     ED Results / Procedures / Treatments   Labs (all labs ordered are listed, but only abnormal results are displayed) Labs Reviewed - No data to display  EKG None  Radiology No results found.  Procedures Procedures   Medications Ordered in ED Medications  albuterol (VENTOLIN HFA) 108 (90 Base) MCG/ACT inhaler 2 puff (has no administration in time range)    ED Course  I have  reviewed the triage vital signs and the nursing notes.  Pertinent labs & imaging results that were available during my care of the patient were reviewed by me and considered in my medical decision making (see chart for details).    MDM Rules/Calculators/A&P                          Patient here with uncomplicated asthma exacerbation.  Patient given albuterol inhaler.  Prednisone sent to pharmacy.  He is in no respiratory distress.  Appears stable for discharge  Vitals:   07/06/20 0049  BP: (!) 123/96  Pulse: (!) 56  Resp: 20  Temp: 98.4 F (36.9 C)  SpO2: 99%    Final Clinical Impression(s) / ED Diagnoses Final diagnoses:  Mild asthma with exacerbation, unspecified whether persistent    Rx / DC Orders ED Discharge Orders    None       Roxy Horseman, PA-C 07/06/20 0052    Maia Plan, MD 07/06/20  [phone]

## 2020-11-19 ENCOUNTER — Emergency Department (HOSPITAL_COMMUNITY): Payer: Self-pay

## 2020-11-19 ENCOUNTER — Emergency Department (HOSPITAL_COMMUNITY)
Admission: EM | Admit: 2020-11-19 | Discharge: 2020-11-20 | Disposition: A | Discharge status: Home / Self Care | Payer: Self-pay | Attending: Emergency Medicine | Admitting: Emergency Medicine

## 2020-11-19 ENCOUNTER — Encounter (HOSPITAL_COMMUNITY): Payer: Self-pay

## 2020-11-19 ENCOUNTER — Other Ambulatory Visit: Payer: Self-pay

## 2020-11-19 DIAGNOSIS — S82091A Other fracture of right patella, initial encounter for closed fracture: Secondary | ICD-10-CM | POA: Insufficient documentation

## 2020-11-19 DIAGNOSIS — Y9389 Activity, other specified: Secondary | ICD-10-CM | POA: Insufficient documentation

## 2020-11-19 DIAGNOSIS — X501XXA Overexertion from prolonged static or awkward postures, initial encounter: Secondary | ICD-10-CM | POA: Insufficient documentation

## 2020-11-19 MED ORDER — ACETAMINOPHEN 500 MG PO TABS
1000.0000 mg | ORAL_TABLET | Freq: Once | ORAL | Status: AC
  Administered 2020-11-19: 1000 mg via ORAL
  Filled 2020-11-19: qty 2

## 2020-11-19 NOTE — ED Triage Notes (Signed)
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  This occurred at 1900.  Pt c/o pain in right leg.

## 2020-11-19 NOTE — ED Provider Notes (Signed)
Emergency Medicine Provider Triage Evaluation Note  GARO HEIDELBERG , a 25 y.o. male  was evaluated in triage.  Pt complains of right knee dislocation.  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    He also complains of mild right ankle pain.  States that his foot feels numb.    Hx of club foot.  Review of Systems  Positive: Knee pain, R foot numbness Negative: Fever, cough  Physical Exam  BP (!) 143/106 (BP Location: Right Arm)   Pulse 80   Temp 98.3 F (36.8 C) (Oral)   Resp 18   SpO2 98%  Gen:   Awake, no distress   Resp:  Normal effort  MSK:   Unable to move right leg Other:  R DP/Pt pulses intact, club foot  Medical Decision Making  Medically screening exam initiated at 11:09 PM.  Appropriate orders placed.  Leamon Arnt was informed that the remainder of the evaluation will be completed by another provider, this initial triage assessment does not replace that evaluation, and the importance of remaining in the ED until their evaluation is complete.  Knee pain   Roxy Horseman, PA-C 11/19/20 2312    Blane Ohara, MD 11/19/20 [phone]

## 2020-11-20 ENCOUNTER — Emergency Department (HOSPITAL_COMMUNITY): Payer: Self-pay

## 2020-11-20 LAB — I-STAT CHEM 8, ED
BUN: 11 mg/dL (ref 6–20)
Calcium, Ion: 1.24 mmol/L (ref 1.15–1.40)
Chloride: 104 mmol/L (ref 98–111)
Creatinine, Ser: 1.2 mg/dL (ref 0.61–1.24)
Glucose, Bld: 107 mg/dL — ABNORMAL HIGH (ref 70–99)
HCT: 47 % (ref 39.0–52.0)
Hemoglobin: 16 g/dL (ref 13.0–17.0)
Potassium: 4.2 mmol/L (ref 3.5–5.1)
Sodium: 140 mmol/L (ref 135–145)
TCO2: 26 mmol/L (ref 22–32)

## 2020-11-20 MED ORDER — IBUPROFEN 800 MG PO TABS
800.0000 mg | ORAL_TABLET | Freq: Once | ORAL | Status: AC
  Administered 2020-11-20: 800 mg via ORAL
  Filled 2020-11-20: qty 2

## 2020-11-20 MED ORDER — HYDROCODONE-ACETAMINOPHEN 5-325 MG PO TABS
2.0000 | ORAL_TABLET | Freq: Once | ORAL | Status: AC
  Administered 2020-11-20: 2 via ORAL
  Filled 2020-11-20: qty 2

## 2020-11-20 MED ORDER — IBUPROFEN 800 MG PO TABS: 800.0000 mg | ORAL_TABLET | Freq: Three times a day (TID) | ORAL | 0 refills | Status: AC

## 2020-11-20 MED ORDER — IOHEXOL 350 MG/ML SOLN
100.0000 mL | Freq: Once | INTRAVENOUS | Status: AC | PRN
  Administered 2020-11-20: 100 mL via INTRAVENOUS

## 2020-11-20 NOTE — Discharge Instructions (Addendum)
You will need to follow-up with the orthopedic doctor.  Wear the brace and use the crutches until you see the orthopedic.

## 2020-11-20 NOTE — Progress Notes (Signed)
Orthopedic Tech Progress Note Patient Details:  Bradley Gibson 1995-03-19 [id number]  Ortho Devices Type of Ortho Device: Knee Immobilizer, Crutches Ortho Device/Splint Location: RLE Ortho Device/Splint Interventions: Ordered, Application, Adjustment   Post Interventions Patient Tolerated: Well Instructions Provided: Care of device, Adjustment of device, Poper ambulation with device  [name] 11/20/2020, 3:20 AM

## 2020-11-20 NOTE — ED Provider Notes (Signed)
[facility] EMERGENCY DEPARTMENT Provider Note   CSN: [id number] Arrival date & time: 11/19/20  2026     History Chief Complaint  Patient presents with   Leg Injury    Bradley Gibson is a 25 y.o. male.  Patient presents to the emergency department with a chief complaint of right knee injury.  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  He states that his foot bent backwards up and touched his shoulder.  He states that it looked like his knee dislocated.  He felt a pop.  He complains of right foot numbness.  The history is provided by the patient. No language interpreter was used.      No past medical history on file.  Patient Active Problem List   Diagnosis Date Noted   Major depressive disorder, recurrent episode, severe (HCC) 10/06/2017   Substance induced mood disorder (HCC) 10/06/2017    No past surgical history on file.     No family history on file.  Social History   Tobacco Use   Smoking status: Never   Smokeless tobacco: Never  Vaping Use   Vaping Use: Never used  Substance Use Topics   Alcohol use: Not Currently   Drug use: Never    Home Medications Prior to Admission medications   Medication Sig Start Date End Date Taking? Authorizing Provider  albuterol (PROVENTIL HFA) 108 (90 Base) MCG/ACT inhaler  06/17/13   [provider]  DULoxetine 40 MG CPEP Take 40 mg by mouth daily. 10/13/17   Oneta Rack, NP  predniSONE (DELTASONE) 20 MG tablet Take 2 tablets (40 mg total) by mouth daily. Take 40 mg by mouth daily for 3 days, then 20mg  by mouth daily for 3 days, then 10mg  daily for 3 days 07/06/20   , PA-C  traZODone (DESYREL) 50 MG tablet Take 1 tablet (50 mg total) by mouth at bedtime as needed for sleep. 10/12/17   Roxy Horseman, NP    Allergies    Patient has no known allergies.  Review of Systems   Review of Systems  All other systems reviewed and are  negative.  Physical Exam Updated Vital Signs BP (!) 140/103   Pulse 78   Temp 98.2 F (36.8 C) (Oral)   Resp 18   SpO2 99%   Physical Exam Nursing note and vitals reviewed.  Constitutional: Pt appears well-developed and well-nourished. No distress.  HENT:  Head: Normocephalic and atraumatic.  Eyes: Conjunctivae are normal.  Neck: Normal range of motion.  Cardiovascular: Normal rate, regular rhythm. Intact distal pulses.   Capillary refill < 3 sec.  Pulmonary/Chest: Effort normal and breath sounds normal.  Musculoskeletal:  Right knee Pt exhibits swelling and tenderness to palpation.   ROM: Limited secondary to pain Strength: Deferred Neurological: Pt  is alert. Coordination normal.  Sensation: 5/5 Skin: Skin is warm and dry. Pt is not diaphoretic.  No evidence of open wound or skin tenting Psychiatric: Pt has a normal mood and affect.   ED Results / Procedures / Treatments   Labs (all labs ordered are listed, but only abnormal results are displayed) Labs Reviewed  I-STAT CHEM 8, ED - Abnormal; Notable for the following components:      Result Value   Glucose, Bld 107 (*)    All other components within normal limits    EKG None  Radiology DG Ankle Complete Right  Result Date: 11/20/2020 CLINICAL  DATA:  The right lateral foot and ankle pain after a fall. EXAM: RIGHT FOOT COMPLETE - 3+ VIEW; RIGHT ANKLE - COMPLETE 3+ VIEW COMPARISON:  None. FINDINGS: Three views of the right foot and three views of the right ankle are obtained. The right foot and ankle appear intact. No acute displaced fractures identified. The metatarsal-phalangeal joints are extended with flexion of the toes. This may be positional or could indicate ligamentous injury. Correlation with physical examination is recommended. Joint spaces are normal. No focal bone lesion or bone destruction. Soft tissues are unremarkable. IMPRESSION: 1. No acute fracture or dislocation. 2. Flexion of the toes with  extension of the metatarsal-phalangeal joints, possibly positional or ligamentous injury. Correlate with physical examination. Electronically Signed   By: Burman Nieves M.D.   On: 11/20/2020 00:19   CT ANGIO LOW EXTREM RIGHT W &/OR WO CONTRAST  Result Date: 11/20/2020 CLINICAL DATA:  Lower extremity vascular trauma suspect dislocation EXAM: CT ANGIOGRAPHY LOWER RIGHT EXTREMITY TECHNIQUE: Multidetector CT imaging of the pelvis and right lower extremity was performed using the standard protocol during bolus administration of intravenous contrast. Multiplanar CT image reconstructions and MIPs were obtained to evaluate the vascular anatomy. CONTRAST:  OMNIPAQUE IOHEXOL 350 MG/ML SOLN COMPARISON:  None. FINDINGS: VASCULAR Aorta: Normal caliber aorta were visualized. No aneurysm or acute findings. IMA: Patent where visualized. RIGHT Lower Extremity Inflow: Common, internal and external iliac arteries are patent without evidence of aneurysm, dissection, vasculitis or significant stenosis. Outflow: Common, superficial and profunda femoral arteries and the popliteal artery are patent without evidence of aneurysm, dissection, vasculitis or significant stenosis. No evidence of active bleed. Runoff: There is three-vessel runoff to the distal aspect of the lower leg, 2 vessel runoff to the ankle. Can not assess foot vascularity due to contrast bolus timing. LEFT Lower Extremity (included on axial imaging only) Inflow: Common, internal and external iliac arteries are patent without evidence of aneurysm, dissection, vasculitis or significant stenosis. Outflow: Common, superficial and profunda femoral arteries and the popliteal artery are patent without evidence of aneurysm, dissection, vasculitis or significant stenosis. Runoff: There is three-vessel runoff to the distal lower leg and 2 vessel runoff to the ankle, similar to contralateral leg. Can not assess foot vascularity due to contrast bolus timing Veins: Not  well assessed on this unenhanced exam. Review of the MIP images confirms the above findings. NON-VASCULAR Bladder: Unremarkable. Stomach/Bowel: No inflammation of pelvic bowel loops. No abnormal bowel distension. Normal appendix. Lymphatic: No pelvic adenopathy. Reproductive: Prostate is unremarkable. Other: No pelvic free fluid. Musculoskeletal: Fracture through the right medial patellar facet with lateral patellar subluxation. May be a small avulsion fracture from the lateral patella. Moderately large knee joint effusion. Soft tissue thickening in the region of the medial patellofemoral retinaculum. No obvious quadriceps or patellar tendon rupture. There is generalized soft tissue edema around the right knee. Review of the MIP images confirms the above findings. IMPRESSION: 1. Normal right lower extremity vasculature without evidence of active bleed or traumatic injury. 2. Acute fracture through the right medial patellar facet with lateral patellar subluxation, possible sequela of patellar dislocation relocation injury. Moderately large knee joint effusion. Soft tissue thickening in the region of the medial patellofemoral retinaculum. Electronically Signed   By: Narda Rutherford M.D.   On: 11/20/2020 00:52   DG Foot Complete Right  Result Date: 11/20/2020 CLINICAL DATA:  The right lateral foot and ankle pain after a fall. EXAM: RIGHT FOOT COMPLETE - 3+ VIEW; RIGHT ANKLE - COMPLETE 3+ VIEW  COMPARISON:  None. FINDINGS: Three views of the right foot and three views of the right ankle are obtained. The right foot and ankle appear intact. No acute displaced fractures identified. The metatarsal-phalangeal joints are extended with flexion of the toes. This may be positional or could indicate ligamentous injury. Correlation with physical examination is recommended. Joint spaces are normal. No focal bone lesion or bone destruction. Soft tissues are unremarkable. IMPRESSION: 1. No acute fracture or dislocation. 2.  Flexion of the toes with extension of the metatarsal-phalangeal joints, possibly positional or ligamentous injury. Correlate with physical examination. Electronically Signed   By: Burman Nieves M.D.   On: 11/20/2020 00:19    Procedures Procedures   Medications Ordered in ED Medications  ibuprofen (ADVIL) tablet 800 mg (has no administration in time range)  acetaminophen (TYLENOL) tablet 1,000 mg (1,000 mg Oral Given 11/19/20 2326)  iohexol (OMNIPAQUE) 350 MG/ML injection 100 mL (100 mLs Intravenous Contrast Given 11/20/20 0037)    ED Course  I have reviewed the triage vital signs and the nursing notes.  Pertinent labs & imaging results that were available during my care of the patient were reviewed by me and considered in my medical decision making (see chart for details).    MDM Rules/Calculators/A&P                           Patient here with right knee pain.  History somewhat concerning for knee dislocation, despite it being located at this time.  Will check CT to rule out vascular injury.  CT shows no vascular injury.  There is a patella fracture, and he likely had a patella dislocation.  I will place the patient in a knee immobilizer and given crutches.  Recommend outpatient orthopedic follow-up.  No evidence of fracture or dislocation of the right foot.  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  Final Clinical Impression(s) / ED Diagnoses Final diagnoses:  Other closed fracture of right patella, initial encounter    Rx / DC Orders ED Discharge Orders     None        Roxy Horseman, PA-C 11/20/20 0258    Gilda Crease, MD 11/20/20 [phone]

## 2020-11-20 NOTE — ED Notes (Signed)
Ortho tech called 

## 2022-04-18 ENCOUNTER — Telehealth: Payer: Self-pay | Admitting: Plastic Surgery

## 2022-04-18 NOTE — Telephone Encounter (Signed)
Received request for all documentation from this practice for Disability Determinations Services.  Faxed reply to [employer] that patient not seen in this office from 02/13/21 as requested.

## 2022-10-04 IMAGING — DX DG FOREARM 2V*L*
2 series · 2 of 2 positions shown · non-contrast
Comparison: None.

CLINICAL DATA: Left wrist pain after fall down stairs.

EXAM:
LEFT FOREARM - 2 VIEW

[forearm ap]
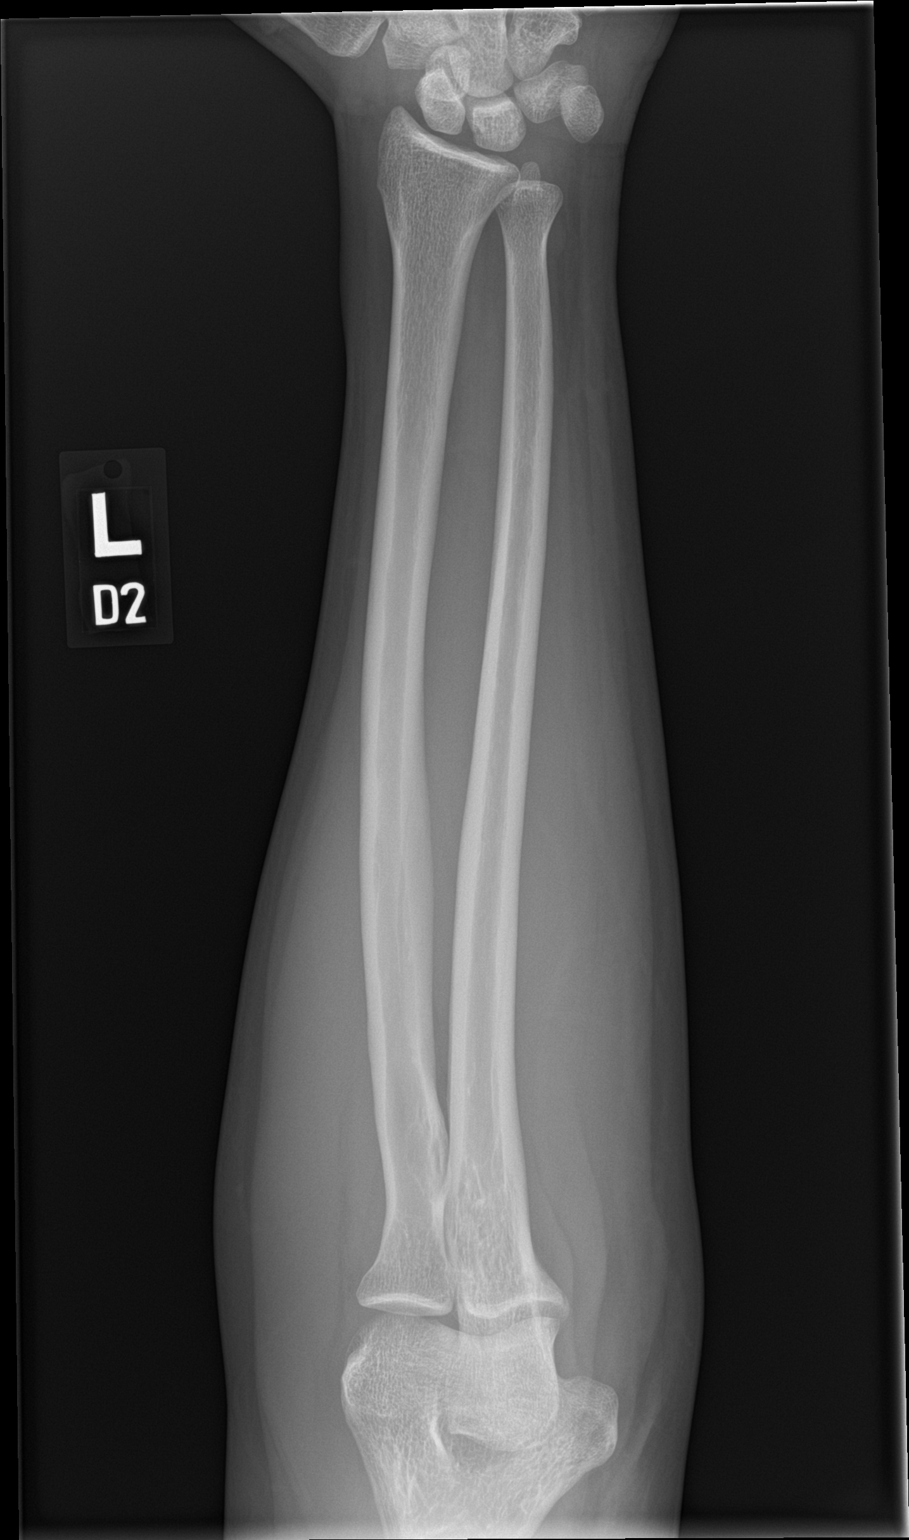

[forearm lat]
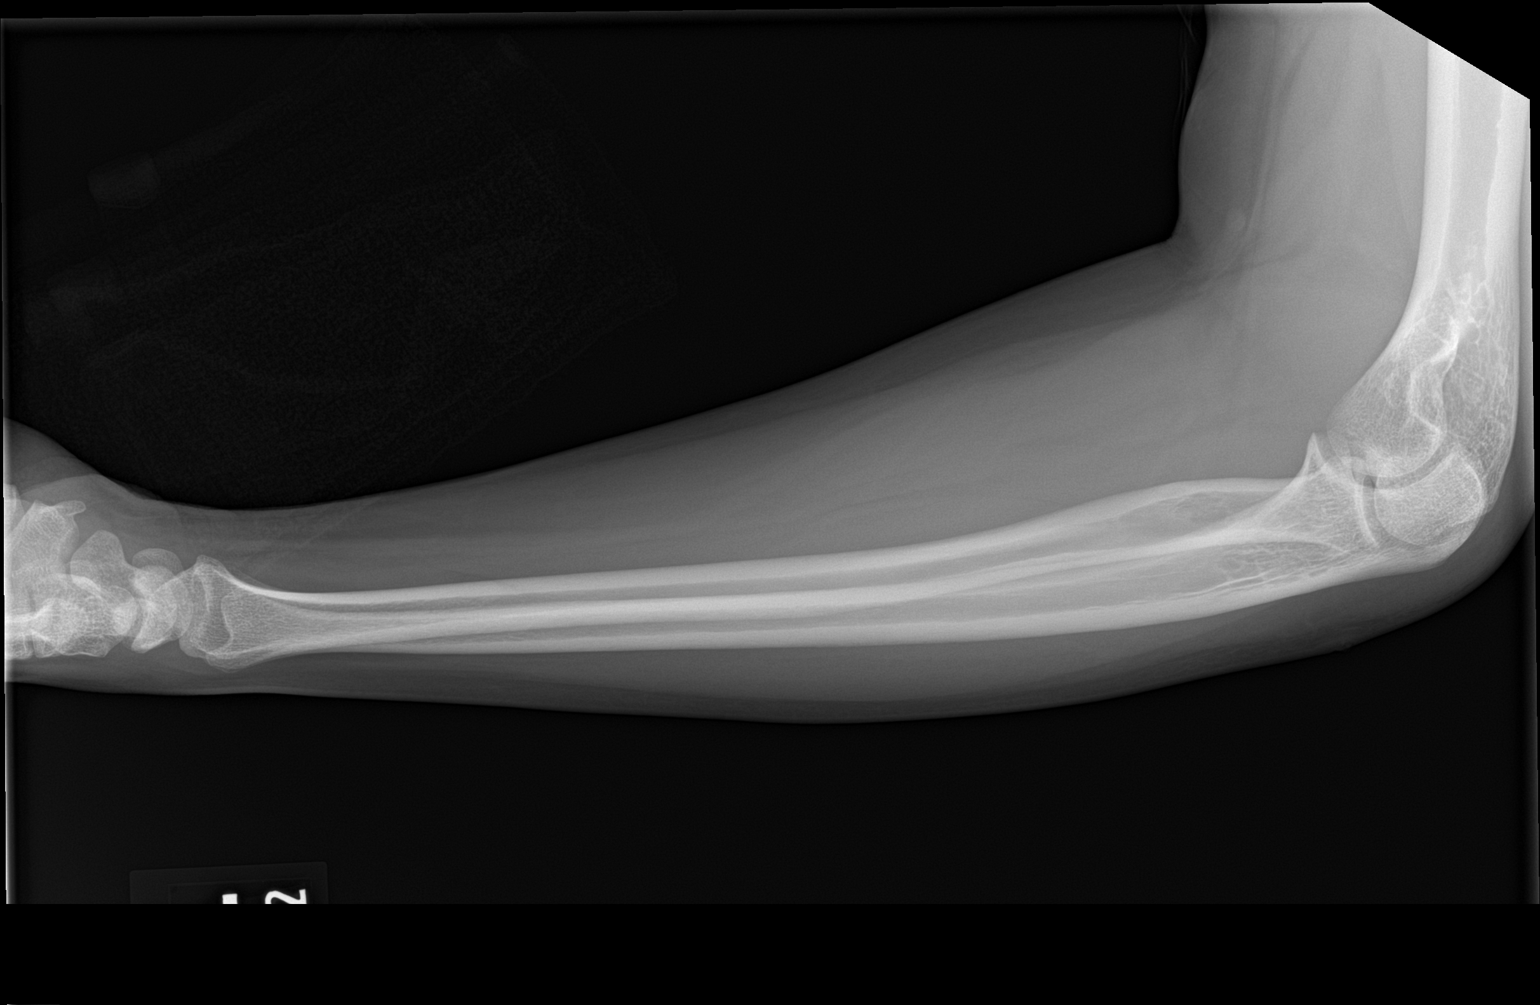

[2 of 2 positions shown; findings below may reference images not displayed]

FINDINGS: There is no evidence of fracture or other focal bone lesions. Soft
tissues are unremarkable.
IMPRESSION: Negative.

## 2023-03-18 IMAGING — CT CT ANGIO EXTREM LOW*R*
1 of 5 series · 12 of 33 positions shown · IV contrast (OMNI 350)
Comparison: None.

CLINICAL DATA: Lower extremity vascular trauma suspect dislocation

EXAM:
CT ANGIOGRAPHY LOWER RIGHT EXTREMITY
TECHNIQUE: Multidetector CT imaging of the pelvis and right lower extremity was
performed using the standard protocol during bolus administration of
intravenous contrast. Multiplanar CT image reconstructions and MIPs
were obtained to evaluate the vascular anatomy.
CONTRAST:  100mL OMNIPAQUE IOHEXOL 350 MG/ML SOLN

[Series 5: rt ext cta (id) · axial · 0.74mm/px · z∈[+112,+1090]mm · 12 of 386 slices shown]
[im 30/386  soft-tissue]
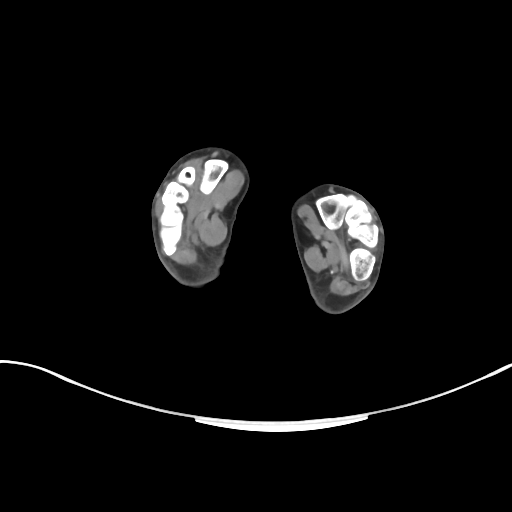
[im 60/386  bone]
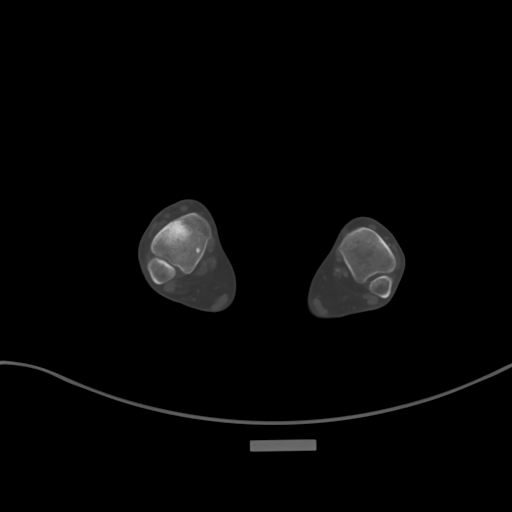
[im 89/386  soft-tissue]
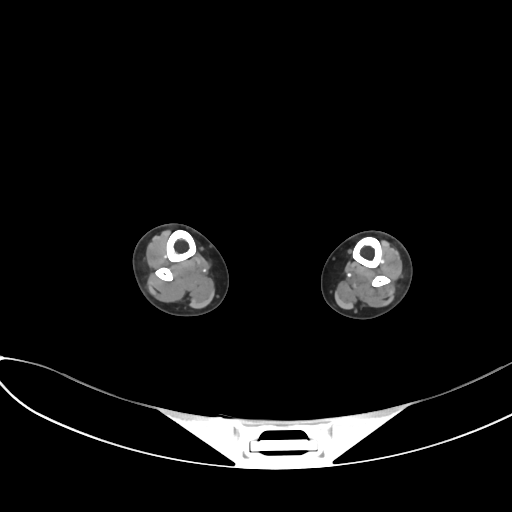
[im 119/386  bone]
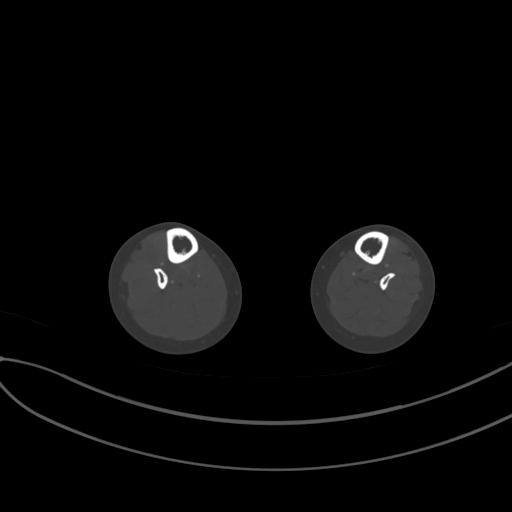
[im 149/386  soft-tissue]
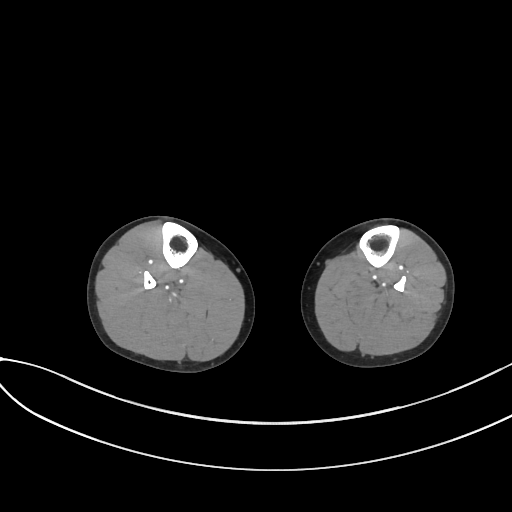
[im 178/386  bone]
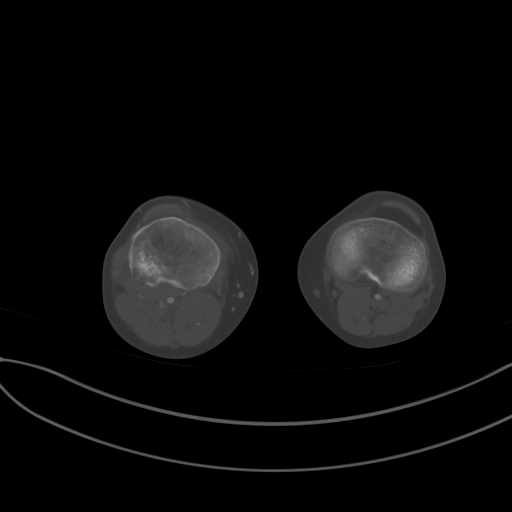
[im 208/386  soft-tissue]
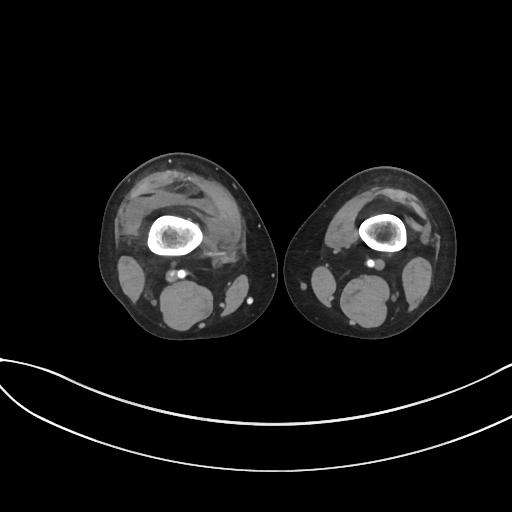
[im 237/386  bone]
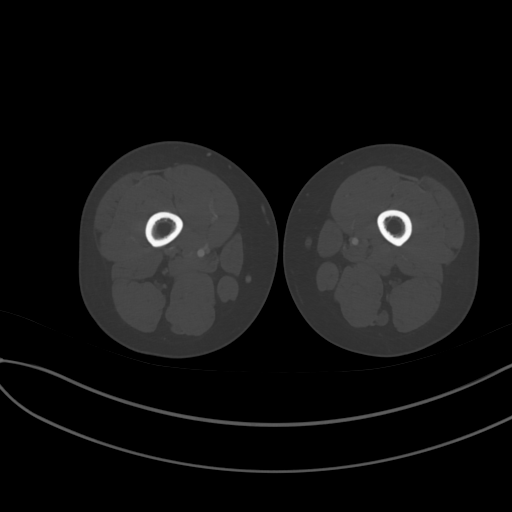
[im 267/386  soft-tissue]
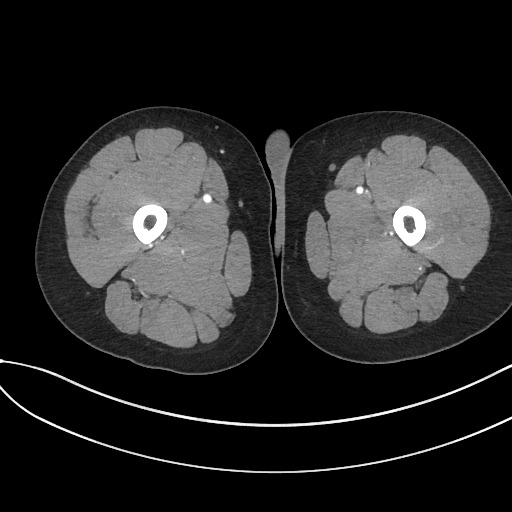
[im 297/386  bone]
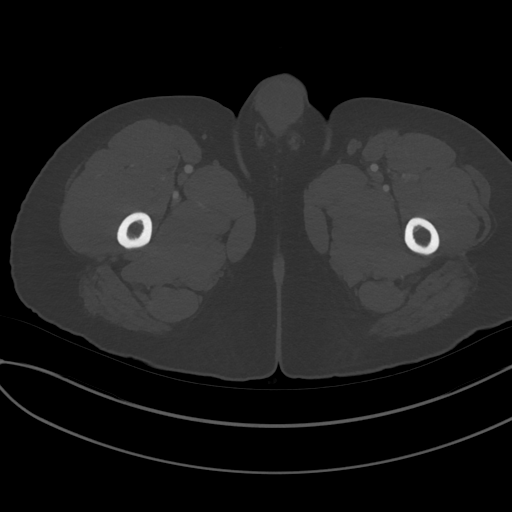
[im 326/386  soft-tissue]
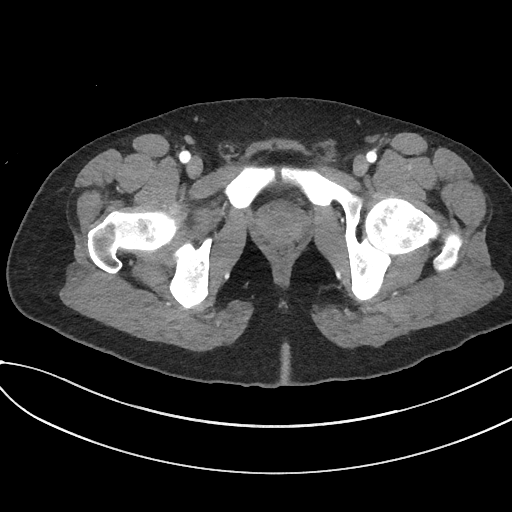
[im 356/386  bone]
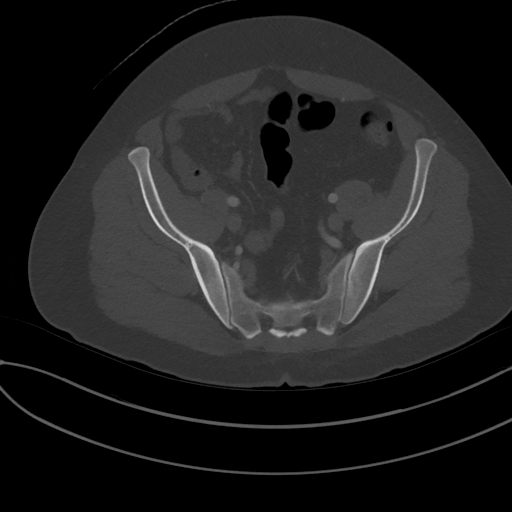

[12 of 33 positions shown; findings below may reference images not displayed]

FINDINGS: VASCULAR

Aorta: Normal caliber aorta were visualized. No aneurysm or acute
findings.

IMA: Patent where visualized.

RIGHT Lower Extremity

Inflow: Common, internal and external iliac arteries are patent
without evidence of aneurysm, dissection, vasculitis or significant
stenosis.

Outflow: Common, superficial and profunda femoral arteries and the
popliteal artery are patent without evidence of aneurysm,
dissection, vasculitis or significant stenosis. No evidence of
active bleed.

Runoff: There is three-vessel runoff to the distal aspect of the
lower leg, 2 vessel runoff to the ankle. Can not assess foot
vascularity due to contrast bolus timing.

LEFT Lower Extremity (included on axial imaging only)

Inflow: Common, internal and external iliac arteries are patent
without evidence of aneurysm, dissection, vasculitis or significant
stenosis.

Outflow: Common, superficial and profunda femoral arteries and the
popliteal artery are patent without evidence of aneurysm,
dissection, vasculitis or significant stenosis.

Runoff: There is three-vessel runoff to the distal lower leg and 2
vessel runoff to the ankle, similar to contralateral leg. Can not
assess foot vascularity due to contrast bolus timing

Veins: Not well assessed on this unenhanced exam.

Review of the MIP images confirms the above findings.

NON-VASCULAR

Bladder: Unremarkable.

Stomach/Bowel: No inflammation of pelvic bowel loops. No abnormal
bowel distension. Normal appendix.

Lymphatic: No pelvic adenopathy.

Reproductive: Prostate is unremarkable.

Other: No pelvic free fluid.

Musculoskeletal: Fracture through the right medial patellar facet
with lateral patellar subluxation. May be a small avulsion fracture
from the lateral patella. Moderately large knee joint effusion. Soft
tissue thickening in the region of the medial patellofemoral
retinaculum. No obvious quadriceps or patellar tendon rupture. There
is generalized soft tissue edema around the right knee.

Review of the MIP images confirms the above findings.
IMPRESSION: 1. Normal right lower extremity vasculature without evidence of
active bleed or traumatic injury.
2. Acute fracture through the right medial patellar facet with
lateral patellar subluxation, possible sequela of patellar
dislocation relocation injury. Moderately large knee joint effusion.
Soft tissue thickening in the region of the medial patellofemoral
retinaculum.
